# Patient Record
Sex: Female | Born: 1985 | Race: Black or African American | Hispanic: No | Marital: Single | Smoking: Never smoker
Health system: Southern US, Community
[De-identification: ages and names within clinical notes are randomized; demographics above are authoritative.]

## PROBLEM LIST (undated history)

## (undated) DIAGNOSIS — M609 Myositis, unspecified: Secondary | ICD-10-CM

## (undated) DIAGNOSIS — M1990 Unspecified osteoarthritis, unspecified site: Secondary | ICD-10-CM

## (undated) DIAGNOSIS — I011 Acute rheumatic endocarditis: Secondary | ICD-10-CM

## (undated) HISTORY — PX: EYE SURGERY: SHX253

## (undated) HISTORY — DX: Myositis, unspecified: M60.9

## (undated) HISTORY — DX: Acute rheumatic endocarditis: I01.1

## (undated) HISTORY — DX: Unspecified osteoarthritis, unspecified site: M19.90

---

## 2010-11-27 ENCOUNTER — Inpatient Hospital Stay (INDEPENDENT_AMBULATORY_CARE_PROVIDER_SITE_OTHER)
Admission: RE | Admit: 2010-11-27 | Discharge: 2010-11-27 | Disposition: A | Discharge status: Home / Self Care | Payer: Self-pay | Source: Ambulatory Visit | Attending: Emergency Medicine | Admitting: Emergency Medicine

## 2010-11-27 DIAGNOSIS — K006 Disturbances in tooth eruption: Secondary | ICD-10-CM

## 2010-11-29 ENCOUNTER — Inpatient Hospital Stay (INDEPENDENT_AMBULATORY_CARE_PROVIDER_SITE_OTHER)
Admission: RE | Admit: 2010-11-29 | Discharge: 2010-11-29 | Disposition: A | Discharge status: Home / Self Care | Payer: Self-pay | Source: Ambulatory Visit | Attending: Emergency Medicine | Admitting: Emergency Medicine

## 2010-11-29 DIAGNOSIS — T50995A Adverse effect of other drugs, medicaments and biological substances, initial encounter: Secondary | ICD-10-CM

## 2010-11-29 DIAGNOSIS — I10 Essential (primary) hypertension: Secondary | ICD-10-CM

## 2011-08-03 ENCOUNTER — Encounter (HOSPITAL_COMMUNITY): Payer: Self-pay | Admitting: *Deleted

## 2011-08-03 ENCOUNTER — Emergency Department (HOSPITAL_COMMUNITY)
Admission: EM | Admit: 2011-08-03 | Discharge: 2011-08-03 | Disposition: A | Discharge status: Home / Self Care | Payer: BC Managed Care – PPO | Attending: Emergency Medicine | Admitting: Emergency Medicine

## 2011-08-03 DIAGNOSIS — M546 Pain in thoracic spine: Secondary | ICD-10-CM | POA: Insufficient documentation

## 2011-08-03 DIAGNOSIS — H9209 Otalgia, unspecified ear: Secondary | ICD-10-CM | POA: Insufficient documentation

## 2011-08-03 DIAGNOSIS — J45909 Unspecified asthma, uncomplicated: Secondary | ICD-10-CM | POA: Insufficient documentation

## 2011-08-03 DIAGNOSIS — R599 Enlarged lymph nodes, unspecified: Secondary | ICD-10-CM | POA: Insufficient documentation

## 2011-08-03 DIAGNOSIS — J3489 Other specified disorders of nose and nasal sinuses: Secondary | ICD-10-CM | POA: Insufficient documentation

## 2011-08-03 DIAGNOSIS — B9789 Other viral agents as the cause of diseases classified elsewhere: Secondary | ICD-10-CM | POA: Insufficient documentation

## 2011-08-03 DIAGNOSIS — IMO0001 Reserved for inherently not codable concepts without codable children: Secondary | ICD-10-CM | POA: Insufficient documentation

## 2011-08-03 DIAGNOSIS — B349 Viral infection, unspecified: Secondary | ICD-10-CM

## 2011-08-03 MED ORDER — IBUPROFEN 600 MG PO TABS: 600.0000 mg | ORAL_TABLET | Freq: Four times a day (QID) | ORAL | Status: AC | PRN

## 2011-08-03 NOTE — ED Notes (Signed)
C/o body aches, primarily legs and neck, hurts to swallow, onset ~ 2 weeks ago, also reports "feel hot & cold", had chills, (denies: nvd, fever, sob or dizziness). Works in an assisted living facility. Mild redness swelling and irritation in throat noted.

## 2011-08-03 NOTE — ED Notes (Signed)
Complaining of sore throat, left neck pain, and bilateral leg pain. States it began with her ears. Felt like fluid was in her ears. States the pain has moved down from ears to her throat. Pain with swallowing

## 2011-08-03 NOTE — Discharge Instructions (Signed)
Viral Infections A viral infection can be caused by different types of viruses.Most viral infections are not serious and resolve on their own. However, some infections may cause severe symptoms and may lead to further complications. SYMPTOMS Viruses can frequently cause:  Minor sore throat.   Aches and pains.   Headaches.   Runny nose.   Different types of rashes.   Watery eyes.   Tiredness.   Cough.   Loss of appetite.   Gastrointestinal infections, resulting in nausea, vomiting, and diarrhea.  These symptoms do not respond to antibiotics because the infection is not caused by bacteria. However, you might catch a bacterial infection following the viral infection. This is sometimes called a "superinfection." Symptoms of such a bacterial infection may include:  Worsening sore throat with pus and difficulty swallowing.   Swollen neck glands.   Chills and a high or persistent fever.   Severe headache.   Tenderness over the sinuses.   Persistent overall ill feeling (malaise), muscle aches, and tiredness (fatigue).   Persistent cough.   Yellow, green, or brown mucus production with coughing.  HOME CARE INSTRUCTIONS   Only take over-the-counter or prescription medicines for pain, discomfort, diarrhea, or fever as directed by your caregiver.   Drink enough water and fluids to keep your urine clear or pale yellow. Sports drinks can provide valuable electrolytes, sugars, and hydration.   Get plenty of rest and maintain proper nutrition. Soups and broths with crackers or rice are fine.  SEEK IMMEDIATE MEDICAL CARE IF:   You have severe headaches, shortness of breath, chest pain, neck pain, or an unusual rash.   You have uncontrolled vomiting, diarrhea, or you are unable to keep down fluids.   You or your child has an oral temperature above 102 F (38.9 C), not controlled by medicine.   Your baby is older than 3 months with a rectal temperature of 102 F (38.9 C) or  higher.   Your baby is 50 months old or younger with a rectal temperature of 100.4 F (38 C) or higher.  MAKE SURE YOU:   Understand these instructions.   Will watch your condition.   Will get help right away if you are not doing well or get worse.  Document Released: 03/03/2005 Document Revised: 02/03/2011 Document Reviewed: 09/28/2010 [facility] Patient Information 2012 [place], [place]. Stay home rest, take alternating doses of Tylenol or ibuprofen for body aches, or fevers.  Try to drink plenty of fluids

## 2011-08-03 NOTE — ED Provider Notes (Signed)
History     CSN: [id number]  Arrival date & time 08/03/11  1955   First MD Initiated Contact with Patient 08/03/11 2207      Chief Complaint  Patient presents with  . Generalized Body Aches  . 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  She has had several days of myalgias, sore throat, ear pain, nasal congestion.  Denies dysuria, nausea, vomiting, diarrhea.  She has not taken any over-the-counter medications  Patient is a 26 y.o. female presenting with pharyngitis. The history is provided by the patient.  Sore Throat This is a new problem. The current episode started in the past 7 days. The problem occurs constantly. The problem has been unchanged. Associated symptoms include congestion, myalgias, a sore throat and swollen glands. Pertinent negatives include no abdominal pain, anorexia, chills, coughing, fever, nausea or vomiting. The symptoms are aggravated by nothing. She has tried nothing for the symptoms. The treatment provided no relief.    Past Medical History  Diagnosis Date  . Asthma     Past Surgical History  Procedure Date  . Eye surgery     Family History  Problem Relation Age of Onset  . Diabetes Other     History  Substance Use Topics  . Smoking status: Never Smoker   . Smokeless tobacco: Not on file  . Alcohol Use: No    OB History    Grav Para Term Preterm Abortions TAB SAB Ect Mult Living                  Review of Systems  Constitutional: Negative for fever and chills.  HENT: Positive for ear pain, congestion and sore throat. Negative for rhinorrhea.   Respiratory: Negative for cough.   Gastrointestinal: Negative for nausea, vomiting, abdominal pain and anorexia.  Musculoskeletal: Positive for myalgias.  Neurological: Negative for dizziness.    Allergies   Penicillins  Home Medications  No current outpatient prescriptions on file.  BP 100/58  Pulse 99  Temp 99.3 F (37.4 C)  Resp 18  SpO2 99%  LMP 07/03/2011  Physical Exam  Constitutional: She is oriented to person, place, and time. She appears well-developed and well-nourished.  HENT:  Head: Normocephalic.  Right Ear: External ear normal.  Left Ear: External ear normal.  Mouth/Throat: No oropharyngeal exudate.  Cardiovascular: Normal rate.   Pulmonary/Chest: Effort normal.  Abdominal: Soft.  Musculoskeletal: Normal range of motion.  Neurological: She is alert and oriented to person, place, and time.  Skin: Skin is warm and dry.    ED Course  Procedures (including critical care time)  Labs Reviewed - No data to display No results found.   No diagnosis found.    MDM  Viral illness        Arman Filter, NP 08/03/11 2244

## 2011-08-05 NOTE — ED Provider Notes (Signed)
Medical screening examination/treatment/procedure(s) were performed by non-physician practitioner and as supervising physician I was immediately available for consultation/collaboration.   Celene Kras, MD 08/05/11 [phone]

## 2011-08-30 ENCOUNTER — Encounter (INDEPENDENT_AMBULATORY_CARE_PROVIDER_SITE_OTHER): Payer: Self-pay | Admitting: Surgery

## 2011-08-31 ENCOUNTER — Encounter (INDEPENDENT_AMBULATORY_CARE_PROVIDER_SITE_OTHER): Payer: Self-pay | Admitting: Surgery

## 2011-08-31 ENCOUNTER — Ambulatory Visit (INDEPENDENT_AMBULATORY_CARE_PROVIDER_SITE_OTHER): Payer: BC Managed Care – PPO | Admitting: Surgery

## 2011-08-31 VITALS — BP 112/60 | HR 76 | Temp 98.4°F | Resp 12 | Ht 63.0 in | Wt 96.2 lb

## 2011-08-31 DIAGNOSIS — M3390 Dermatopolymyositis, unspecified, organ involvement unspecified: Secondary | ICD-10-CM | POA: Insufficient documentation

## 2011-08-31 DIAGNOSIS — M3320 Polymyositis, organ involvement unspecified: Secondary | ICD-10-CM

## 2011-08-31 NOTE — Patient Instructions (Signed)
Muscle Biopsy Your caregiver has recommended that you have a muscle biopsy (tissue sample) to confirm or prove a suspected muscular problem. During the biopsy, a small piece of muscle tissue is removed. It is then examined under a microscope by a pathologist (a specialist in tissue examination). Chemical tests can be run if they are indicated. Biopsies are taken when your caregiver cannot be 100% certain of the diagnosis (learning what is wrong) by physical exam, X-rays, or other studies. LET YOUR CAREGIVER KNOW ABOUT:  Allergies.   Medications taken including herbs, eye drops, over the counter medications, and creams.   Use of steroids (by mouth or creams)   Previous problems with anesthetics or novocaine.   Possibility of pregnancy, if this applies.   History of blood clots (thrombophlebitis).   History of bleeding or blood problems.   Previous surgery.   Other health problems.  BEFORE THE PROCEDURE  You should be present 60 minutes prior to your procedure or as directed.  PROCEDURE  A biopsy is often performed as a same day surgery. This can be done in a hospital or clinic. Biopsies are often performed under local anesthesia. This is accomplished by injecting medicine that makes the area of biopsy numb. General anesthesia is usually required for very young children, this means they would be sleeping through the procedure. Muscle biopsies are generally performed by making an incision and removing a small piece of muscle.  AFTER THE PROCEDURE  You will be taken to the recovery area where a nurse will watch and check your progress. Once you are awake, stable, and taking fluids well, barring other problems you will be allowed to go home. If the procedure has been minor you may be allowed to go home immediately. Once home, an ice pack applied to your operative site may help with discomfort and keep swelling down.  You may resume normal diet and activities as directed.   If the muscle  biopsy was performed on an arm or leg, avoid vigorous activity until your surgeon says it is okay.   Change dressings as directed.   Only take over-the-counter or prescription medicines for pain, discomfort, or fever as directed by your caregiver.   Call for your results as instructed by your caregiver. Remember it is your responsibility to obtain the results of your biopsy and any other tests performed. Do not assume everything is fine if you do not hear from your caregiver.  SEEK MEDICAL CARE IF:   You have increased bleeding (more than a small spot) from biopsy sites.   You develop redness, swelling, or increasing pain in the biopsy sites.   There is pus coming from the wound.   You have an unexplained oral temperature over 102 F (38.9 C).   A foul smell is coming from the wound or dressing.  SEEK IMMEDIATE MEDICAL CARE IF:   You develop a rash.   You have difficulty breathing.   You have any allergic problems.  Document Released: 08/30/2000 Document Revised: 05/13/2011 Document Reviewed: 09/13/2008 [employer] Patient Information 2012 [employer]. 

## 2011-08-31 NOTE — Progress Notes (Signed)
Chief Complaint  Patient presents with  . New Evaluation    rule out polymyositis - referral from Dr. Pollyann Savoy, [facility]    HISTORY: The patient is a 26 year old black female referred by her rheumatologist for quadriceps muscle biopsy to rule out polymyositis. Patient states that symptoms began acutely in February 2013. She has developed diffuse muscle aches and pains. She has developed significant muscle weakness. She has had significant weight loss. Patient has been evaluated by her rheumatologist and started on steroids. She is referred at this time for quadriceps muscle biopsy to rule out polymyositis.  Past Medical History  Diagnosis Date  . Asthma   . Myositis      Current Outpatient Prescriptions  Medication Sig Dispense Refill  . predniSONE (DELTASONE) 10 MG tablet as directed.         Allergies  Allergen Reactions  . Penicillins Nausea Only     Family History  Problem Relation Age of Onset  . Diabetes Other      History   Social History  . Marital Status: Single    Spouse Name: N/A    Number of Children: N/A  . Years of Education: N/A   Social History Main Topics  . Smoking status: Never Smoker   . Smokeless tobacco: None  . Alcohol Use: No  . Drug Use: No  . Sexually Active:    Other Topics Concern  . None   Social History Narrative  . None     REVIEW OF SYSTEMS - PERTINENT POSITIVES ONLY: As noted above, diffuse muscle aches and pains, weakness, weight loss  EXAM: Filed Vitals:   08/31/11 1327  BP: 112/60  Pulse: 76  Temp: 98.4 F (36.9 C)  Resp: 12    HEENT: normocephalic; pupils equal and reactive; sclerae clear; dentition good; mucous membranes moist NECK:  symmetric on extension; no palpable anterior or posterior cervical lymphadenopathy; no supraclavicular masses; no tenderness CHEST: clear to auscultation bilaterally without rales, rhonchi, or wheezes CARDIAC: regular rate and rhythm without significant murmur; peripheral  pulses are full EXT:  non-tender without edema; no deformity NEURO: no gross focal deficits; no sign of tremor; global weakness   LABORATORY RESULTS: See [facility] ([facility]) for most recent results   RADIOLOGY RESULTS: See [facility] ([facility]-Epic) for most recent results   IMPRESSION: Rule out polymyositis rheumatica  PLAN: I discussed quadriceps muscle biopsy with the patient. Likely we will take this from the vastus medialis. I explained the location of the surgical wound and the type of suture material to be employed. I explained the possibility of poor wound healing due to the patient currently taking steroids. She understands and wishes to proceed. We will make arrangements for muscle biopsy to be performed as an outpatient procedure in the near future.  The risks and benefits of the procedure have been discussed at length with the patient.  The patient understands the proposed procedure, potential alternative treatments, and the course of recovery to be expected.  All of the patient's questions have been answered at this time.  The patient wishes to proceed with surgery.   Velora Heckler, MD, FACS General & Endocrine Surgery [facility]   Visit Diagnoses: 1. Polymyositis     Primary Care Physician: No primary provider on file.  Rheumatology: Dr. Pollyann Savoy

## 2011-09-01 ENCOUNTER — Encounter (HOSPITAL_BASED_OUTPATIENT_CLINIC_OR_DEPARTMENT_OTHER): Payer: Self-pay | Admitting: *Deleted

## 2011-09-02 ENCOUNTER — Encounter (HOSPITAL_BASED_OUTPATIENT_CLINIC_OR_DEPARTMENT_OTHER)
Admission: RE | Disposition: A | Discharge status: Home / Self Care | Payer: Self-pay | Source: Ambulatory Visit | Attending: Surgery

## 2011-09-02 ENCOUNTER — Encounter (HOSPITAL_BASED_OUTPATIENT_CLINIC_OR_DEPARTMENT_OTHER): Payer: Self-pay | Admitting: *Deleted

## 2011-09-02 ENCOUNTER — Encounter (HOSPITAL_BASED_OUTPATIENT_CLINIC_OR_DEPARTMENT_OTHER): Payer: Self-pay | Admitting: Anesthesiology

## 2011-09-02 ENCOUNTER — Ambulatory Visit (HOSPITAL_BASED_OUTPATIENT_CLINIC_OR_DEPARTMENT_OTHER)
Admission: RE | Admit: 2011-09-02 | Discharge: 2011-09-02 | Disposition: A | Discharge status: Home / Self Care | Payer: BC Managed Care – PPO | Source: Ambulatory Visit | Attending: Surgery | Admitting: Surgery

## 2011-09-02 ENCOUNTER — Ambulatory Visit (HOSPITAL_BASED_OUTPATIENT_CLINIC_OR_DEPARTMENT_OTHER): Payer: BC Managed Care – PPO | Admitting: Anesthesiology

## 2011-09-02 DIAGNOSIS — J45909 Unspecified asthma, uncomplicated: Secondary | ICD-10-CM | POA: Insufficient documentation

## 2011-09-02 DIAGNOSIS — G729 Myopathy, unspecified: Secondary | ICD-10-CM

## 2011-09-02 DIAGNOSIS — M3320 Polymyositis, organ involvement unspecified: Secondary | ICD-10-CM

## 2011-09-02 DIAGNOSIS — M6250 Muscle wasting and atrophy, not elsewhere classified, unspecified site: Secondary | ICD-10-CM | POA: Insufficient documentation

## 2011-09-02 HISTORY — PX: MUSCLE BIOPSY: SHX716

## 2011-09-02 SURGERY — MUSCLE BIOPSY
Anesthesia: General | Site: Thigh | Laterality: Left | Wound class: Clean

## 2011-09-02 MED ORDER — FENTANYL CITRATE 0.05 MG/ML IJ SOLN: 25.0000 ug | INTRAMUSCULAR | Status: DC | PRN

## 2011-09-02 MED ORDER — HYDROCODONE-ACETAMINOPHEN 10-325 MG PO TABS: 1.0000 | ORAL_TABLET | ORAL | Status: AC | PRN

## 2011-09-02 MED ORDER — BUPIVACAINE HCL (PF) 0.5 % IJ SOLN
INTRAMUSCULAR | Status: DC | PRN
  Administered 2011-09-02: 10 mL

## 2011-09-02 MED ORDER — ONDANSETRON HCL 4 MG/2ML IJ SOLN
INTRAMUSCULAR | Status: DC | PRN
  Administered 2011-09-02: 4 mg via INTRAVENOUS

## 2011-09-02 MED ORDER — FENTANYL CITRATE 0.05 MG/ML IJ SOLN
INTRAMUSCULAR | Status: DC | PRN
  Administered 2011-09-02: 50 ug via INTRAVENOUS

## 2011-09-02 MED ORDER — VANCOMYCIN HCL IN DEXTROSE 1-5 GM/200ML-% IV SOLN
1000.0000 mg | INTRAVENOUS | Status: AC
  Administered 2011-09-02 (×2): 1000 mg via INTRAVENOUS

## 2011-09-02 MED ORDER — HYDROCODONE-ACETAMINOPHEN 10-325 MG PO TABS: 1.0000 | ORAL_TABLET | Freq: Once | ORAL | Status: DC

## 2011-09-02 MED ORDER — DEXAMETHASONE SODIUM PHOSPHATE 4 MG/ML IJ SOLN
INTRAMUSCULAR | Status: DC | PRN
  Administered 2011-09-02: 10 mg via INTRAVENOUS

## 2011-09-02 MED ORDER — ONDANSETRON HCL 4 MG/2ML IJ SOLN: 4.0000 mg | Freq: Four times a day (QID) | INTRAMUSCULAR | Status: DC | PRN

## 2011-09-02 MED ORDER — MIDAZOLAM HCL 5 MG/5ML IJ SOLN
INTRAMUSCULAR | Status: DC | PRN
  Administered 2011-09-02: 1 mg via INTRAVENOUS

## 2011-09-02 MED ORDER — LIDOCAINE HCL (CARDIAC) 20 MG/ML IV SOLN
INTRAVENOUS | Status: DC | PRN
  Administered 2011-09-02: 55 mg via INTRAVENOUS

## 2011-09-02 MED ORDER — LACTATED RINGERS IV SOLN
INTRAVENOUS | Status: DC
  Administered 2011-09-02: 10:00:00 via INTRAVENOUS

## 2011-09-02 MED ORDER — PROPOFOL 10 MG/ML IV EMUL
INTRAVENOUS | Status: DC | PRN
  Administered 2011-09-02: 200 mg via INTRAVENOUS

## 2011-09-02 SURGICAL SUPPLY — 42 items
BANDAGE ELASTIC 4 VELCRO ST LF (GAUZE/BANDAGES/DRESSINGS) ×2 IMPLANT
BANDAGE GAUZE ELAST BULKY 4 IN (GAUZE/BANDAGES/DRESSINGS) IMPLANT
BENZOIN TINCTURE PRP APPL 2/3 (GAUZE/BANDAGES/DRESSINGS) ×2 IMPLANT
BLADE SURG 15 STRL LF DISP TIS (BLADE) ×1 IMPLANT
BLADE SURG 15 STRL SS (BLADE) ×1
BNDG COHESIVE 4X5 TAN STRL (GAUZE/BANDAGES/DRESSINGS) IMPLANT
CHLORAPREP W/TINT 26ML (MISCELLANEOUS) ×2 IMPLANT
CLEANER CAUTERY TIP 5X5 PAD (MISCELLANEOUS) IMPLANT
CLOTH BEACON ORANGE TIMEOUT ST (SAFETY) ×2 IMPLANT
COVER MAYO STAND STRL (DRAPES) ×2 IMPLANT
COVER TABLE BACK 60X90 (DRAPES) ×2 IMPLANT
DECANTER SPIKE VIAL GLASS SM (MISCELLANEOUS) IMPLANT
DRAPE EXTREMITY T 121X128X90 (DRAPE) IMPLANT
DRAPE PED LAPAROTOMY (DRAPES) ×2 IMPLANT
DRAPE U-SHAPE 76X120 STRL (DRAPES) IMPLANT
DRAPE UTILITY XL STRL (DRAPES) ×2 IMPLANT
DRSG TEGADERM 4X4.75 (GAUZE/BANDAGES/DRESSINGS) IMPLANT
ELECT REM PT RETURN 9FT ADLT (ELECTROSURGICAL) ×2
ELECTRODE REM PT RTRN 9FT ADLT (ELECTROSURGICAL) ×1 IMPLANT
GAUZE SPONGE 4X4 12PLY STRL LF (GAUZE/BANDAGES/DRESSINGS) ×4 IMPLANT
GLOVE BIOGEL M 7.0 STRL (GLOVE) ×2 IMPLANT
GLOVE BIOGEL PI IND STRL 7.5 (GLOVE) ×1 IMPLANT
GLOVE BIOGEL PI INDICATOR 7.5 (GLOVE) ×1
GLOVE SURG ORTHO 8.0 STRL STRW (GLOVE) ×2 IMPLANT
GOWN PREVENTION PLUS XLARGE (GOWN DISPOSABLE) ×2 IMPLANT
GOWN PREVENTION PLUS XXLARGE (GOWN DISPOSABLE) ×2 IMPLANT
NEEDLE HYPO 25X1 1.5 SAFETY (NEEDLE) ×2 IMPLANT
PACK BASIN DAY SURGERY FS (CUSTOM PROCEDURE TRAY) ×2 IMPLANT
PAD CLEANER CAUTERY TIP 5X5 (MISCELLANEOUS)
PENCIL BUTTON HOLSTER BLD 10FT (ELECTRODE) ×2 IMPLANT
SHEET MEDIUM DRAPE 40X70 STRL (DRAPES) ×2 IMPLANT
SLEEVE SCD COMPRESS KNEE MED (MISCELLANEOUS) ×2 IMPLANT
STOCKINETTE 4X48 STRL (DRAPES) IMPLANT
STRIP CLOSURE SKIN 1/2X4 (GAUZE/BANDAGES/DRESSINGS) ×2 IMPLANT
SUT ETHILON 3 0 PS 1 (SUTURE) ×2 IMPLANT
SUT MNCRL AB 4-0 PS2 18 (SUTURE) ×2 IMPLANT
SUT VICRYL 3-0 CR8 SH (SUTURE) ×2 IMPLANT
SUT VICRYL 4-0 PS2 18IN ABS (SUTURE) IMPLANT
SYR CONTROL 10ML LL (SYRINGE) ×2 IMPLANT
TOWEL OR 17X24 6PK STRL BLUE (TOWEL DISPOSABLE) ×4 IMPLANT
TOWEL OR NON WOVEN STRL DISP B (DISPOSABLE) ×2 IMPLANT
WATER STERILE IRR 1000ML POUR (IV SOLUTION) IMPLANT

## 2011-09-02 NOTE — Anesthesia Preprocedure Evaluation (Signed)
Anesthesia Evaluation  Patient identified by MRN, date of birth, ID band Patient awake    Reviewed: Allergy & Precautions, H&P , NPO status , Patient's Chart, lab work & pertinent test results  Airway Mallampati: II  Neck ROM: full    Dental   Pulmonary asthma ,          Cardiovascular     Neuro/Psych  Neuromuscular disease    GI/Hepatic   Endo/Other    Renal/GU      Musculoskeletal   Abdominal   Peds  Hematology   Anesthesia Other Findings   Reproductive/Obstetrics                           Anesthesia Physical Anesthesia Plan  ASA: II  Anesthesia Plan: General   Post-op Pain Management:    Induction: Intravenous  Airway Management Planned: LMA  Additional Equipment:   Intra-op Plan:   Post-operative Plan:   Informed Consent: I have reviewed the patients History and Physical, chart, labs and discussed the procedure including the risks, benefits and alternatives for the proposed anesthesia with the patient or authorized representative who has indicated his/her understanding and acceptance.     Plan Discussed with: CRNA and Surgeon  Anesthesia Plan Comments:         Anesthesia Quick Evaluation

## 2011-09-02 NOTE — Interval H&P Note (Signed)
History and Physical Interval Note:  09/02/2011 10:31 AM  Elizabeth Jensen  has presented today for surgery, with the diagnosis of r/o polymyositis.  The various methods of treatment have been discussed with the patient and family. After consideration of risks, benefits and other options for treatment, the patient has consented to    Procedure(s) (LRB): MUSCLE BIOPSY (N/A) as a surgical intervention .    The patients' history has been reviewed, patient examined, no change in status, stable for surgery.  I have reviewed the patients' chart and labs.  Questions were answered to the patient's satisfaction.    Velora Heckler, MD, [facility] Office: [phone]    Latwan Luchsinger Judie Petit

## 2011-09-02 NOTE — Anesthesia Procedure Notes (Signed)
Procedure Name: LMA Insertion Date/Time: 09/02/2011 10:48 AM Performed by: Zenia Resides D Pre-anesthesia Checklist: Patient identified, Emergency Drugs available, Suction available, Patient being monitored and Timeout performed Patient Re-evaluated:Patient Re-evaluated prior to inductionOxygen Delivery Method: Circle System Utilized Preoxygenation: Pre-oxygenation with 100% oxygen Intubation Type: IV induction Ventilation: Mask ventilation without difficulty LMA: LMA inserted LMA Size: 4.0 Number of attempts: 1 Airway Equipment and Method: bite block Placement Confirmation: positive ETCO2 and breath sounds checked- equal and bilateral Tube secured with: Tape Dental Injury: Teeth and Oropharynx as per pre-operative assessment

## 2011-09-02 NOTE — H&P (View-Only) (Signed)
Chief Complaint  Patient presents with  . New Evaluation    rule out polymyositis - referral from Dr. Shaili Deveshwar, [facility]    HISTORY: The patient is a 25-year-old black female referred by her rheumatologist for quadriceps muscle biopsy to rule out polymyositis. Patient states that symptoms began acutely in February 2013. She has developed diffuse muscle aches and pains. She has developed significant muscle weakness. She has had significant weight loss. Patient has been evaluated by her rheumatologist and started on steroids. She is referred at this time for quadriceps muscle biopsy to rule out polymyositis.  Past Medical History  Diagnosis Date  . Asthma   . Myositis      Current Outpatient Prescriptions  Medication Sig Dispense Refill  . predniSONE (DELTASONE) 10 MG tablet as directed.         Allergies  Allergen Reactions  . Penicillins Nausea Only     Family History  Problem Relation Age of Onset  . Diabetes Other      History   Social History  . Marital Status: Single    Spouse Name: N/A    Number of Children: N/A  . Years of Education: N/A   Social History Main Topics  . Smoking status: Never Smoker   . Smokeless tobacco: None  . Alcohol Use: No  . Drug Use: No  . Sexually Active:    Other Topics Concern  . None   Social History Narrative  . None     REVIEW OF SYSTEMS - PERTINENT POSITIVES ONLY: As noted above, diffuse muscle aches and pains, weakness, weight loss  EXAM: Filed Vitals:   08/31/11 1327  BP: 112/60  Pulse: 76  Temp: 98.4 F (36.9 C)  Resp: 12    HEENT: normocephalic; pupils equal and reactive; sclerae clear; dentition good; mucous membranes moist NECK:  symmetric on extension; no palpable anterior or posterior cervical lymphadenopathy; no supraclavicular masses; no tenderness CHEST: clear to auscultation bilaterally without rales, rhonchi, or wheezes CARDIAC: regular rate and rhythm without significant murmur; peripheral  pulses are full EXT:  non-tender without edema; no deformity NEURO: no gross focal deficits; no sign of tremor; global weakness   LABORATORY RESULTS: See [facility] ([facility]L-Epic) for most recent results   RADIOLOGY RESULTS: See [facility] ([facility]-Epic) for most recent results   IMPRESSION: Rule out polymyositis rheumatica  PLAN: I discussed quadriceps muscle biopsy with the patient. Likely we will take this from the vastus medialis. I explained the location of the surgical wound and the type of suture material to be employed. I explained the possibility of poor wound healing due to the patient currently taking steroids. She understands and wishes to proceed. We will make arrangements for muscle biopsy to be performed as an outpatient procedure in the near future.  The risks and benefits of the procedure have been discussed at length with the patient.  The patient understands the proposed procedure, potential alternative treatments, and the course of recovery to be expected.  All of the patient's questions have been answered at this time.  The patient wishes to proceed with surgery.   Malayla Granberry M. Krew Hortman, MD, FACS General & Endocrine Surgery [facility]   Visit Diagnoses: 1. Polymyositis     Primary Care Physician: No primary provider on file.  Rheumatology: Dr. Shaili Deveshwar  

## 2011-09-02 NOTE — Brief Op Note (Signed)
09/02/2011  11:24 AM  PATIENT:  Elizabeth Jensen  26 y.o. female  PRE-OPERATIVE DIAGNOSIS:  r/o polymyositis  POST-OPERATIVE DIAGNOSIS:  r/o polymyositis  PROCEDURE:  Procedure(s) (LRB): QUADRICEPS MUSCLE BIOPSY (Left)  SURGEON:  Surgeon(s) and Role:    * Velora Heckler, MD - Primary  ANESTHESIA:   general  EBL:  Total I/O In: 600 [I.V.:600] Out: -   BLOOD ADMINISTERED:none  DRAINS: none   LOCAL MEDICATIONS USED:  MARCAINE     SPECIMEN:  Excision  DISPOSITION OF SPECIMEN:  PATHOLOGY  COUNTS:  YES  TOURNIQUET:  * No tourniquets in log *  DICTATION: Reubin Milan Dictation and Other Dictation: Dictation Number [id number]  PLAN OF CARE: Discharge to home after PACU  PATIENT DISPOSITION:  PACU - hemodynamically stable.   Delay start of Pharmacological VTE agent (>24hrs) due to surgical blood loss or risk of bleeding: yes  Velora Heckler, MD, [facility] Office: [phone]

## 2011-09-02 NOTE — Anesthesia Postprocedure Evaluation (Signed)
Anesthesia Post Note  Patient: Elizabeth Jensen  Procedure(s) Performed: Procedure(s) (LRB): MUSCLE BIOPSY (Left)  Anesthesia type: General  Patient location: PACU  Post pain: Pain level controlled and Adequate analgesia  Post assessment: Post-op Vital signs reviewed, Patient's Cardiovascular Status Stable, Respiratory Function Stable, Patent Airway and Pain level controlled  Last Vitals:  Filed Vitals:   09/02/11 1130  BP: 132/95  Pulse: 70  Temp: 36.3 C  Resp:     Post vital signs: Reviewed and stable  Level of consciousness: awake, alert  and oriented  Complications: No apparent anesthesia complications

## 2011-09-02 NOTE — Discharge Instructions (Signed)
Surgery Center  [address] [place], [place] [address] [phone]   Post Anesthesia Home Care Instructions  Activity: Get plenty of rest for the remainder of the day. A responsible adult should stay with you for 24 hours following the procedure.  For the next 24 hours, DO NOT: -Drive a car -Operate machinery -Drink alcoholic beverages -Take any medication unless instructed by your physician -Make any legal decisions or sign important papers.  Meals: Start with liquid foods such as gelatin or soup. Progress to regular foods as tolerated. Avoid greasy, spicy, heavy foods. If nausea and/or vomiting occur, drink only clear liquids until the nausea and/or vomiting subsides. Call your physician if vomiting continues.  Special Instructions/Symptoms: Your throat may feel dry or sore from the anesthesia or the breathing tube placed in your throat during surgery. If this causes discomfort, gargle with warm salt water. The discomfort should disappear within 24 hours.   

## 2011-09-02 NOTE — Transfer of Care (Signed)
Immediate Anesthesia Transfer of Care Note  Patient: Elizabeth Jensen  Procedure(s) Performed: Procedure(s) (LRB): MUSCLE BIOPSY (Left)  Patient Location: PACU  Anesthesia Type: General  Level of Consciousness: sedated  Airway & Oxygen Therapy: Patient Spontanous Breathing and Patient connected to face mask oxygen  Post-op Assessment: Report given to PACU RN and Post -op Vital signs reviewed and stable  Post vital signs: Reviewed and stable  Complications: No apparent anesthesia complications

## 2011-09-04 NOTE — Op Note (Signed)
NAME:  Elizabeth Jensen, Elizabeth Jensen                    ACCOUNT NO.:  MEDICAL RECORD NO.:  [id number]  LOCATION:                                 FACILITY:  PHYSICIAN:  Velora Heckler, MD      DATE OF BIRTH:  31-Jan-1986  DATE OF PROCEDURE:  09/02/2011                               OPERATIVE REPORT   PREOPERATIVE DIAGNOSIS:  Rule out polymyositis rheumatica.  POSTOPERATIVE DIAGNOSIS:  Rule out polymyositis rheumatica.  PROCEDURE:  Left medial head quadriceps muscle excisional biopsy.  SURGEON:  Velora Heckler, MD, FACS  ANESTHESIA:  General.  ESTIMATED BLOOD LOSS:  Minimal.  PREPARATION:  ChloraPrep.  COMPLICATIONS:  None.  INDICATIONS:  The patient is a 26 year old, black female undergoing workup for polymyositis by Dr. Pollyann Savoy.  The patient now comes to surgery for quadriceps biopsy.  BODY OF REPORT:  Procedure was done in OR #8 at the [facility].  The patient was brought to the operating room, placed in supine position on the operating room table.  Following administration of general anesthesia, the left lower extremity was positioned and then prepped and draped in the usual strict aseptic fashion.  After ascertaining that an adequate level of anesthesia had been achieved, a 3- cm incision was made over the medial head of the left quadriceps muscle. Dissection was carried down to the fascia.  Fascia was incised and the muscle was exposed.  A segment of the medial head of the quadriceps was excised by dissecting it out longitudinally along the muscle fibers.  It was ligated proximally and distally with a 2-0 silk ligature.  A 2-cm segment was then sharply excised.  It was placed on a tongue depressor and secured with 2-0 silk sutures.  It was wrapped with saline moistened gauze, then submitted fresh to Pathology for review.  Good hemostasis was noted.  Muscle fascia was closed with interrupted 3- 0 Vicryl sutures.  Subcutaneous tissues were closed with  interrupted 3-0 Vicryl sutures.  Tissue was anesthetized with local Marcaine 0.5%.  Skin was closed with a running 4-0 Monocryl subcuticular suture.  Wound was washed and dried and benzoin and Steri-Strips were applied.  Sterile dressings were applied.  The patient was awakened from anesthesia and brought to the recovery room.  The patient tolerated the procedure well.   Velora Heckler, MD, FACS   TMG/MEDQ  D:  09/02/2011  T:  09/02/2011  Job:  [id number]  cc:   Pollyann Savoy, M.D.

## 2011-09-06 ENCOUNTER — Telehealth (INDEPENDENT_AMBULATORY_CARE_PROVIDER_SITE_OTHER): Payer: Self-pay

## 2011-09-06 NOTE — Telephone Encounter (Signed)
Patient called in to make follow up appointment. States Gerkin told her to come back within a week from surgery. Please call her [phone].

## 2011-09-06 NOTE — Telephone Encounter (Signed)
C/o left foot and ankle swelling since surgery 09/02/2011. Left toes and foot cool to the touch.  Per Dr. Magnus Ivan have patient go the ER for evaluation.  Left medial head quadriceps muscle biopsy on 09/02/2011.

## 2011-09-07 ENCOUNTER — Encounter (HOSPITAL_BASED_OUTPATIENT_CLINIC_OR_DEPARTMENT_OTHER): Payer: Self-pay

## 2011-09-07 ENCOUNTER — Encounter (HOSPITAL_BASED_OUTPATIENT_CLINIC_OR_DEPARTMENT_OTHER): Payer: Self-pay | Admitting: Surgery

## 2011-09-13 ENCOUNTER — Ambulatory Visit (INDEPENDENT_AMBULATORY_CARE_PROVIDER_SITE_OTHER): Payer: BC Managed Care – PPO | Admitting: Surgery

## 2011-09-13 ENCOUNTER — Encounter (INDEPENDENT_AMBULATORY_CARE_PROVIDER_SITE_OTHER): Payer: Self-pay | Admitting: Surgery

## 2011-09-13 VITALS — BP 110/68 | HR 66 | Temp 97.6°F | Resp 18 | Ht 63.0 in | Wt 99.2 lb

## 2011-09-13 DIAGNOSIS — M3320 Polymyositis, organ involvement unspecified: Secondary | ICD-10-CM

## 2011-09-13 NOTE — Progress Notes (Signed)
Visit Diagnoses: 1. Polymyositis     HISTORY: Patient returns today for followup having undergone left quadriceps muscle biopsy to evaluate for possible myositis. Final pathology results are pending.  EXAM: Surgical wound is healing uneventfully on the medial aspect of the left quadriceps muscle. Steri-Strips remain in place. Minimal soft tissue swelling. No sign of infection.  IMPRESSION: Rule out myositis, status post left quadriceps muscle biopsy  PLAN: Patient will followup with her rheumatologist next week. Pathology results are currently pending. Wound care instructions are given to the patient and family.  Patient will return to see me as needed.  Velora Heckler, MD, FACS General & Endocrine Surgery [facility]

## 2011-09-13 NOTE — Patient Instructions (Signed)
  COCOA BUTTER & VITAMIN E CREAM  ([employer] or other brand)  Apply cocoa butter/vitamin E cream to your incision 2 - 3 times daily.  Massage cream into incision for one minute with each application.  Use sunscreen (50 SPF or higher) for first 6 months after surgery if area is exposed to sun.  You may substitute Mederma or other scar reducing creams as desired.   

## 2011-09-21 ENCOUNTER — Other Ambulatory Visit (HOSPITAL_COMMUNITY): Payer: Self-pay | Admitting: Rheumatology

## 2011-09-21 DIAGNOSIS — C801 Malignant (primary) neoplasm, unspecified: Secondary | ICD-10-CM

## 2011-09-24 ENCOUNTER — Ambulatory Visit (HOSPITAL_COMMUNITY)
Admission: RE | Admit: 2011-09-24 | Discharge: 2011-09-24 | Disposition: A | Discharge status: Home / Self Care | Payer: BC Managed Care – PPO | Source: Ambulatory Visit | Attending: Rheumatology | Admitting: Rheumatology

## 2011-09-24 ENCOUNTER — Ambulatory Visit (HOSPITAL_COMMUNITY): Admission: RE | Admit: 2011-09-24 | Payer: BC Managed Care – PPO | Source: Ambulatory Visit

## 2011-09-24 DIAGNOSIS — Z129 Encounter for screening for malignant neoplasm, site unspecified: Secondary | ICD-10-CM | POA: Insufficient documentation

## 2011-09-24 DIAGNOSIS — C801 Malignant (primary) neoplasm, unspecified: Secondary | ICD-10-CM

## 2011-09-24 MED ORDER — IOHEXOL 300 MG/ML  SOLN
80.0000 mL | Freq: Once | INTRAMUSCULAR | Status: AC | PRN
  Administered 2011-09-24: 80 mL via INTRAVENOUS

## 2011-09-28 ENCOUNTER — Ambulatory Visit (HOSPITAL_COMMUNITY)
Admission: RE | Admit: 2011-09-28 | Discharge: 2011-09-28 | Disposition: A | Discharge status: Home / Self Care | Payer: BC Managed Care – PPO | Source: Ambulatory Visit | Attending: Rheumatology | Admitting: Rheumatology

## 2011-09-28 DIAGNOSIS — M7989 Other specified soft tissue disorders: Secondary | ICD-10-CM | POA: Insufficient documentation

## 2011-09-28 DIAGNOSIS — M79609 Pain in unspecified limb: Secondary | ICD-10-CM | POA: Insufficient documentation

## 2011-09-28 DIAGNOSIS — M79606 Pain in leg, unspecified: Secondary | ICD-10-CM

## 2011-10-14 ENCOUNTER — Institutional Professional Consult (permissible substitution): Payer: BC Managed Care – PPO | Admitting: Emergency Medicine

## 2011-11-03 ENCOUNTER — Encounter (INDEPENDENT_AMBULATORY_CARE_PROVIDER_SITE_OTHER): Payer: Self-pay

## 2011-11-25 ENCOUNTER — Institutional Professional Consult (permissible substitution): Payer: BC Managed Care – PPO | Admitting: Emergency Medicine

## 2011-12-28 ENCOUNTER — Encounter (HOSPITAL_COMMUNITY): Payer: Self-pay | Admitting: *Deleted

## 2011-12-28 ENCOUNTER — Emergency Department (HOSPITAL_COMMUNITY): Payer: BC Managed Care – PPO

## 2011-12-28 ENCOUNTER — Emergency Department (HOSPITAL_COMMUNITY)
Admission: EM | Admit: 2011-12-28 | Discharge: 2011-12-29 | Disposition: A | Discharge status: Home / Self Care | Payer: BC Managed Care – PPO | Attending: Emergency Medicine | Admitting: Emergency Medicine

## 2011-12-28 DIAGNOSIS — Z79899 Other long term (current) drug therapy: Secondary | ICD-10-CM | POA: Insufficient documentation

## 2011-12-28 DIAGNOSIS — IMO0002 Reserved for concepts with insufficient information to code with codable children: Secondary | ICD-10-CM | POA: Insufficient documentation

## 2011-12-28 DIAGNOSIS — N390 Urinary tract infection, site not specified: Secondary | ICD-10-CM | POA: Insufficient documentation

## 2011-12-28 DIAGNOSIS — R1011 Right upper quadrant pain: Secondary | ICD-10-CM | POA: Insufficient documentation

## 2011-12-28 DIAGNOSIS — D72829 Elevated white blood cell count, unspecified: Secondary | ICD-10-CM | POA: Insufficient documentation

## 2011-12-28 DIAGNOSIS — R112 Nausea with vomiting, unspecified: Secondary | ICD-10-CM | POA: Insufficient documentation

## 2011-12-28 DIAGNOSIS — IMO0001 Reserved for inherently not codable concepts without codable children: Secondary | ICD-10-CM | POA: Insufficient documentation

## 2011-12-28 LAB — CBC WITH DIFFERENTIAL/PLATELET
Basophils Absolute: 0 10*3/uL (ref 0.0–0.1)
Basophils Relative: 0 % (ref 0–1)
Eosinophils Relative: 0 % (ref 0–5)
HCT: 36.4 % (ref 36.0–46.0)
MCHC: 34.1 g/dL (ref 30.0–36.0)
Monocytes Absolute: 1.1 10*3/uL — ABNORMAL HIGH (ref 0.1–1.0)
Neutro Abs: 17.4 10*3/uL — ABNORMAL HIGH (ref 1.7–7.7)
Platelets: 312 10*3/uL (ref 150–400)
RDW: 17.4 % — ABNORMAL HIGH (ref 11.5–15.5)

## 2011-12-28 LAB — HEPATIC FUNCTION PANEL
AST: 22 U/L (ref 0–37)
Albumin: 3.8 g/dL (ref 3.5–5.2)
Alkaline Phosphatase: 81 U/L (ref 39–117)
Total Bilirubin: 0.4 mg/dL (ref 0.3–1.2)

## 2011-12-28 LAB — BASIC METABOLIC PANEL
BUN: 11 mg/dL (ref 6–23)
Calcium: 10 mg/dL (ref 8.4–10.5)
Chloride: 96 mEq/L (ref 96–112)
Creatinine, Ser: 0.82 mg/dL (ref 0.50–1.10)
GFR calc Af Amer: >90 mL/min (ref >90)

## 2011-12-28 LAB — URINALYSIS, ROUTINE W REFLEX MICROSCOPIC
Ketones, ur: NEGATIVE mg/dL
Nitrite: NEGATIVE
Specific Gravity, Urine: 1.028 (ref 1.005–1.030)
Urobilinogen, UA: 1 mg/dL (ref 0.0–1.0)

## 2011-12-28 MED ORDER — IOHEXOL 300 MG/ML  SOLN: 20.0000 mL | INTRAMUSCULAR | Status: AC

## 2011-12-28 MED ORDER — SODIUM CHLORIDE 0.9 % IV SOLN
Freq: Once | INTRAVENOUS | Status: AC
  Administered 2011-12-28: 20:00:00 via INTRAVENOUS

## 2011-12-28 MED ORDER — ONDANSETRON HCL 4 MG/2ML IJ SOLN
4.0000 mg | Freq: Once | INTRAMUSCULAR | Status: AC
  Administered 2011-12-28: 4 mg via INTRAVENOUS
  Filled 2011-12-28: qty 2

## 2011-12-28 MED ORDER — MORPHINE SULFATE 4 MG/ML IJ SOLN
4.0000 mg | Freq: Once | INTRAMUSCULAR | Status: AC
  Administered 2011-12-28: 4 mg via INTRAVENOUS
  Filled 2011-12-28: qty 1

## 2011-12-28 NOTE — ED Notes (Signed)
RUQ pain that increased since yesterday.  Vomited times one.  No diarrhea or constipation.  No urinary syptoms.  LMP 12/21/11.  No vag discharge or bleeding

## 2011-12-28 NOTE — ED Provider Notes (Signed)
History     CSN: [id number]  Arrival date & time 12/28/11  1610   First MD Initiated Contact with Patient 12/28/11 1939      Chief Complaint  Patient presents with  . Abdominal Pain    (Consider location/radiation/quality/duration/timing/severity/associated sxs/prior treatment) HPI History from patient. 26 year old female presents with abdominal pain. She states that she did notice this intermittently yesterday but it became constant and severe today. Pain is described as sharp and is located to primarily the right upper quadrant with radiation to the right lower quadrant. Pain worsens with palpation. Improves with rest. She has not had anything like this previously. She has had nausea with one episode of emesis associated with this. Denies changes in bowel movements, urinary symptoms, vaginal bleeding or discharge. No fever or chills or changes in appetite. Last menstrual period was July 16 and was normal for her. No history of abdominal surgery.  Past Medical History  Diagnosis Date  . Myositis   . Asthma     NO PROBLEMS NOW    Past Surgical History  Procedure Date  . Eye surgery   . Muscle biopsy 09/02/2011    Procedure: MUSCLE BIOPSY;  Surgeon: Velora Heckler, MD;  Location: [facility];  Service: General;  Laterality: Left;  Quadriceps muscle biospy    Family History  Problem Relation Age of Onset  . Diabetes Other     History  Substance Use Topics  . Smoking status: Never Smoker   . Smokeless tobacco: Never Used  . Alcohol Use: No    OB History    Grav Para Term Preterm Abortions TAB SAB Ect Mult Living                  Review of Systems  All other systems reviewed and are negative.    Allergies  Penicillins  Home Medications   Current Outpatient Rx  Name Route Sig Dispense Refill  . FOLIC ACID 1 MG PO TABS Oral Take 2 mg by mouth daily.    Marland Kitchen METHOTREXATE 2.5 MG PO TABS Oral Take 20 mg by mouth once a week. Takes on Friday!    Marland Kitchen  PREDNISONE 5 MG PO TABS Oral Take 22.5 mg by mouth daily.      BP 106/54  Pulse 87  Temp 99 F (37.2 C) (Oral)  Resp 18  SpO2 100%  LMP 12/21/2011  Physical Exam  Nursing note and vitals reviewed. Constitutional: She appears well-developed and well-nourished. No distress.  HENT:  Head: Normocephalic and atraumatic.  Eyes:       Normal appearance  Neck: Normal range of motion.  Cardiovascular: Normal rate, regular rhythm and normal heart sounds.   Pulmonary/Chest: Effort normal and breath sounds normal. She exhibits no tenderness.  Abdominal: Soft. Bowel sounds are normal. She exhibits no distension. There is tenderness.       Tenderness and guarding in the right upper and lower quadrants, more pronounced in the upper quadrant. There is no suprapubic tenderness. No rebound. Negative jar, obturator.  Musculoskeletal: Normal range of motion.  Neurological: She is alert.  Skin: Skin is warm and dry. She is not diaphoretic.  Psychiatric: She has a normal mood and affect.    ED Course  Procedures (including critical care time)  Labs Reviewed  BASIC METABOLIC PANEL - Abnormal; Notable for the following:    Glucose, Bld 107 (*)     All other components within normal limits  CBC WITH DIFFERENTIAL - Abnormal; Notable for  the following:    WBC 21.0 (*)     RDW 17.4 (*)     Neutrophils Relative 83 (*)     Neutro Abs 17.4 (*)     Lymphocytes Relative 11 (*)     Monocytes Absolute 1.1 (*)     All other components within normal limits  URINALYSIS, ROUTINE W REFLEX MICROSCOPIC - Abnormal; Notable for the following:    Color, Urine AMBER (*)  BIOCHEMICALS MAY BE AFFECTED BY COLOR   APPearance CLOUDY (*)     Bilirubin Urine SMALL (*)     Leukocytes, UA LARGE (*)     All other components within normal limits  URINE MICROSCOPIC-ADD ON - Abnormal; Notable for the following:    Squamous Epithelial / LPF MANY (*)     Bacteria, UA MANY (*)     Crystals CA OXALATE CRYSTALS (*)     All  other components within normal limits  HEPATIC FUNCTION PANEL   US Abdomen Complete  12/28/2011  *RADIOLOGY REPORT*  Clinical Data:  Right upper quadrant abdominal pain and leukocytosis.  ABDOMEN ULTRASOUND  Technique:  Complete abdominal ultrasound examination was performed including evaluation of the liver, gallbladder, bile ducts, pancreas, kidneys, spleen, IVC, and abdominal aorta.  Comparison:  CT of the abdomen dated 09/24/2011  Findings:  Gallbladder:  No shadowing gallstones or echogenic sludge.  No gallbladder wall thickening or pericholecystic fluid.  Negative sonographic Murphy's sign according to the ultrasound technologist.  Common Bile Duct:  Normal caliber of 4 mm.  Liver:  Normal size and echotexture without focal parenchymal abnormality.  Patent portal vein with hepatopetal flow.  IVC:  Patent throughout its visualized course in the abdomen.  Pancreas:  Although the pancreas is difficult to visualize in its entirety, no focal pancreatic abnormality is identified.  Spleen:  The spleen shows normal echotexture and size.  Kidneys:  Both kidneys are normal sonographic appearance without evidence of hydronephrosis or focal lesion.  The right kidney measures 9.5 cm and the left kidney 9.7 cm.  IMPRESSION: Normal abdominal ultrasound.  Original Report Authenticated By: Reola Calkins, M.D.   Ct Abdomen Pelvis W Contrast  12/29/2011  *RADIOLOGY REPORT*  Clinical Data: Right upper quadrant abdominal pain extending to the flank.  CT ABDOMEN AND PELVIS WITH CONTRAST  Technique:  Multidetector CT imaging of the abdomen and pelvis was performed following the standard protocol during bolus administration of intravenous contrast.  Contrast: 1 OMNIPAQUE IOHEXOL 300 MG/ML  SOLN, OMNIPAQUE IOHEXOL 300 MG/ML  SOLN  Comparison: 12/28/2011; 09/24/2011  Findings: Lower lobe linear subsegmental atelectasis noted dependently.  The liver appears unremarkable.  Stable hypodense lesion along the anterior  spleen measures 0.6 cm in diameter, possibly a small splenic hemangioma or cystic lesion.  Pancreas divisum is suspected, without dilatation of the dorsal pancreatic duct.  The pancreas appears otherwise unremarkable.  The adrenal glands appear normal.  The kidneys appear unremarkable, as do the proximal ureters.  The gallbladder appears unremarkable.  No pathologic retroperitoneal or porta hepatis adenopathy is identified.  No pathologic pelvic adenopathy is identified.  Small mesenteric lymph nodes are not pathologically enlarged by size criteria.  A cystic lesion of the right ovary measures 2.9 x 2.3 cm on image 65 of series 2.  There may be another smaller cyst or follicle in the right ovary.  Uterus appears unremarkable.  Trace free pelvic fluid is probably physiologic.  The appendix is difficult to visualize.  A thin candidate structure extending from the tip  of the cecum medially on images 43-46 of series 5 does not appear inflamed.  IMPRESSION:  1.  Right ovarian cyst measures up to 2.9 cm. 2.  Suspected pancreas divisum, without dilatation of the dorsal pancreatic duct. 3.  Poor visualization of the appendix, without localized inflammation observed.  4.  Dependent subsegmental atelectasis in the lower lobes. 5.  Stable 0.6 cm hypodense lesion anteriorly in the spleen, likely a small benign incidental lesion.  Original Report Authenticated By: Dellia Cloud, M.D.     1. Urinary tract infection       MDM  Pt with tenderness to RUQ since yesterday; on exam appears to have tenderness to RUQ and RLQ, worst in upper. She has a white count of 21k. She is nontoxic in appearance. Pt's urine appears to have poss UTI although contaminated with epithelials. Imaging studies negative except for small ovarian cyst on the R. Pt is on methotrexate and prednisone for myositis so it's possible that this is the etiology of her leukocytosis (in combination with poss UTI). Pt will be treated with abx. Reasons to  return for worsening, changing, or new pain discussed. Pt verbalized understanding and agreed to plan.        Grant Fontana, PA-C 12/29/11 [phone]

## 2011-12-28 NOTE — ED Notes (Signed)
Pt given PO contrast by CT tech. Nurse to check in on pt to make sure she is drinking the contrast

## 2011-12-28 NOTE — ED Notes (Signed)
Came in by ems with right upper quad pain that radiates down into flank.  Pt had some vomiting yesterday.  HR- 110-120.

## 2011-12-28 NOTE — ED Notes (Signed)
Pt in US

## 2011-12-29 ENCOUNTER — Emergency Department (HOSPITAL_COMMUNITY): Payer: BC Managed Care – PPO

## 2011-12-29 ENCOUNTER — Encounter (HOSPITAL_COMMUNITY): Payer: Self-pay | Admitting: Radiology

## 2011-12-29 MED ORDER — HYDROCODONE-ACETAMINOPHEN 5-325 MG PO TABS: 1.0000 | ORAL_TABLET | ORAL | Status: AC | PRN

## 2011-12-29 MED ORDER — ONDANSETRON 4 MG PO TBDP: 4.0000 mg | ORAL_TABLET | Freq: Three times a day (TID) | ORAL | Status: AC | PRN

## 2011-12-29 MED ORDER — IOHEXOL 300 MG/ML  SOLN
100.0000 mL | Freq: Once | INTRAMUSCULAR | Status: AC | PRN
  Administered 2011-12-29: 100 mL via INTRAVENOUS

## 2011-12-29 MED ORDER — NITROFURANTOIN MONOHYD MACRO 100 MG PO CAPS: 100.0000 mg | ORAL_CAPSULE | Freq: Two times a day (BID) | ORAL | Status: AC

## 2011-12-29 NOTE — ED Notes (Signed)
Pt back from ct scanner with transport

## 2011-12-29 NOTE — ED Provider Notes (Signed)
Medical screening examination/treatment/procedure(s) were performed by non-physician practitioner and as supervising physician I was immediately available for consultation/collaboration.  Ariana Cavenaugh, MD 12/29/11 2351 

## 2011-12-29 NOTE — ED Notes (Signed)
Pelvic cart at bedside. 

## 2011-12-29 NOTE — ED Notes (Signed)
Pt alert and oriented, with steady gait at time of discharge. Pt given discharge papers and papers explained. All questions answered and pt walked to discharge.  

## 2012-01-03 ENCOUNTER — Ambulatory Visit (INDEPENDENT_AMBULATORY_CARE_PROVIDER_SITE_OTHER): Payer: BC Managed Care – PPO | Admitting: Family Medicine

## 2012-01-03 VITALS — BP 110/76 | HR 84 | Temp 98.8°F | Resp 20 | Ht 64.5 in | Wt 115.0 lb

## 2012-01-03 DIAGNOSIS — R1011 Right upper quadrant pain: Secondary | ICD-10-CM

## 2012-01-03 DIAGNOSIS — M3390 Dermatopolymyositis, unspecified, organ involvement unspecified: Secondary | ICD-10-CM

## 2012-01-03 DIAGNOSIS — Z309 Encounter for contraceptive management, unspecified: Secondary | ICD-10-CM

## 2012-01-03 DIAGNOSIS — Z79899 Other long term (current) drug therapy: Secondary | ICD-10-CM

## 2012-01-03 DIAGNOSIS — IMO0001 Reserved for inherently not codable concepts without codable children: Secondary | ICD-10-CM

## 2012-01-03 LAB — POCT CBC
Granulocyte percent: 91.4 %G — AB (ref 37–80)
HCT, POC: 39.6 % (ref 37.7–47.9)
Hemoglobin: 12.3 g/dL (ref 12.2–16.2)
Lymph, poc: 1.3 (ref 0.6–3.4)
MCH, POC: 29.3 pg (ref 27–31.2)
MCHC: 31.1 g/dL — AB (ref 31.8–35.4)
MCV: 94.3 fL (ref 80–97)
MID (cbc): 0.5 (ref 0–0.9)
MPV: 5.9 fL (ref 0–99.8)
POC Granulocyte: 18.8 — AB (ref 2–6.9)
POC LYMPH PERCENT: 6.4 %L — AB (ref 10–50)
POC MID %: 2.2 %M (ref 0–12)
Platelet Count, POC: 584 10*3/uL — AB (ref 142–424)
RBC: 4.2 M/uL (ref 4.04–5.48)
RDW, POC: 18.8 %
WBC: 20.6 10*3/uL — AB (ref 4.6–10.2)

## 2012-01-03 LAB — POCT URINALYSIS DIPSTICK
Glucose, UA: NEGATIVE
Nitrite, UA: NEGATIVE
Protein, UA: 30
Spec Grav, UA: >=1.03
Urobilinogen, UA: 0.2
pH, UA: 6

## 2012-01-03 LAB — POCT SEDIMENTATION RATE: POCT SED RATE: 50 mm/hr — AB (ref 0–22)

## 2012-01-03 LAB — POCT UA - MICROSCOPIC ONLY
Casts, Ur, LPF, POC: NEGATIVE
Crystals, Ur, HPF, POC: NEGATIVE
Mucus, UA: NEGATIVE
Yeast, UA: NEGATIVE

## 2012-01-03 LAB — POCT URINE PREGNANCY: Preg Test, Ur: NEGATIVE

## 2012-01-03 MED ORDER — MEDROXYPROGESTERONE ACETATE 150 MG/ML IM SUSP
150.0000 mg | Freq: Once | INTRAMUSCULAR | Status: AC
  Administered 2012-01-03: 150 mg via INTRAMUSCULAR

## 2012-01-03 NOTE — Patient Instructions (Addendum)
Place depot medroxyprogesterone acetate injection patient instructions here. Place depot medroxyprogesterone acetate injection patient instructions here.   contraceptionContraception Choices Contraception (birth control) is the use of any methods or devices to prevent pregnancy. Below are some methods to help avoid pregnancy. HORMONAL METHODS   Contraceptive implant. This is a thin, plastic tube containing progesterone hormone. It does not contain estrogen hormone. Your caregiver inserts the tube in the inner part of the upper arm. The tube can remain in place for up to 3 years. After 3 years, the implant must be removed. The implant prevents the ovaries from releasing an egg (ovulation), thickens the cervical mucus which prevents sperm from entering the uterus, and thins the lining of the inside of the uterus.   Progesterone-only injections. These injections are given every 3 months by your caregiver to prevent pregnancy. This synthetic progesterone hormone stops the ovaries from releasing eggs. It also thickens cervical mucus and changes the uterine lining. This makes it harder for sperm to survive in the uterus.   Birth control pills. These pills contain estrogen and progesterone hormone. They work by stopping the egg from forming in the ovary (ovulation). Birth control pills are prescribed by a caregiver.Birth control pills can also be used to treat heavy periods.   Minipill. This type of birth control pill contains only the progesterone hormone. They are taken every day of each month and must be prescribed by your caregiver.   Birth control patch. The patch contains hormones similar to those in birth control pills. It must be changed once a week and is prescribed by a caregiver.   Vaginal ring. The ring contains hormones similar to those in birth control pills. It is left in the vagina for 3 weeks, removed for 1 week, and then a new one is put back in place. The patient must be comfortable  inserting and removing the ring from the vagina.A caregiver's prescription is necessary.   Emergency contraception. Emergency contraceptives prevent pregnancy after unprotected sexual intercourse. This pill can be taken right after sex or up to 5 days after unprotected sex. It is most effective the sooner you take the pills after having sexual intercourse. Emergency contraceptive pills are available without a prescription. Check with your pharmacist. Do not use emergency contraception as your only form of birth control.  BARRIER METHODS   Female condom. This is a thin sheath (latex or rubber) that is worn over the penis during sexual intercourse. It can be used with spermicide to increase effectiveness.   Female condom. This is a soft, loose-fitting sheath that is put into the vagina before sexual intercourse.   Diaphragm. This is a soft, latex, dome-shaped barrier that must be fitted by a caregiver. It is inserted into the vagina, along with a spermicidal jelly. It is inserted before intercourse. The diaphragm should be left in the vagina for 6 to 8 hours after intercourse.   Cervical cap. This is a round, soft, latex or plastic cup that fits over the cervix and must be fitted by a caregiver. The cap can be left in place for up to 48 hours after intercourse.   Sponge. This is a soft, circular piece of polyurethane foam. The sponge has spermicide in it. It is inserted into the vagina after wetting it and before sexual intercourse.   Spermicides. These are chemicals that kill or block sperm from entering the cervix and uterus. They come in the form of creams, jellies, suppositories, foam, or tablets. They do not require a  prescription. They are inserted into the vagina with an applicator before having sexual intercourse. The process must be repeated every time you have sexual intercourse.  INTRAUTERINE CONTRACEPTION  Intrauterine device (IUD). This is a T-shaped device that is put in a woman's uterus  during a menstrual period to prevent pregnancy. There are 2 types:   Copper IUD. This type of IUD is wrapped in copper wire and is placed inside the uterus. Copper makes the uterus and fallopian tubes produce a fluid that kills sperm. It can stay in place for 10 years.   Hormone IUD. This type of IUD contains the hormone progestin (synthetic progesterone). The hormone thickens the cervical mucus and prevents sperm from entering the uterus, and it also thins the uterine lining to prevent implantation of a fertilized egg. The hormone can weaken or kill the sperm that get into the uterus. It can stay in place for 5 years.  PERMANENT METHODS OF CONTRACEPTION  Female tubal ligation. This is when the woman's fallopian tubes are surgically sealed, tied, or blocked to prevent the egg from traveling to the uterus.   Female sterilization. This is when the female has the tubes that carry sperm tied off (vasectomy).This blocks sperm from entering the vagina during sexual intercourse. After the procedure, the man can still ejaculate fluid (semen).  NATURAL PLANNING METHODS  Natural family planning. This is not having sexual intercourse or using a barrier method (condom, diaphragm, cervical cap) on days the woman could become pregnant.   Calendar method. This is keeping track of the length of each menstrual cycle and identifying when you are fertile.   Ovulation method. This is avoiding sexual intercourse during ovulation.   Symptothermal method. This is avoiding sexual intercourse during ovulation, using a thermometer and ovulation symptoms.   Post-ovulation method. This is timing sexual intercourse after you have ovulated.  Regardless of which type or method of contraception you choose, it is important that you use condoms to protect against the transmission of sexually transmitted diseases (STDs). Talk with your caregiver about which form of contraception is most appropriate for you. Document Released:  05/24/2005 Document Revised: 05/13/2011 Document Reviewed: 09/30/2010 [facility] Patient Information 2012 [place], [place].

## 2012-01-03 NOTE — Progress Notes (Signed)
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  She was diagnosed last March after diffuse muscle pain, sweats, adenopathy, weakness, and fatigue.  She  Was initially  Told she had a virus.  Now Dr. Corliss Skains is treating her. Complains of RUQ pain:  Was seen in ED over the  Weekend and had an MRI last Tuesday which did not yield a diagnosis. Currently on her period  O:  NAD.  Moon facies Acneiform facial rash Tender RUQ with no HSM Chest:  Clear Heart:  Reg, no murmur CT ABDOMEN AND PELVIS WITH CONTRAST  Technique: Multidetector CT imaging of the abdomen and pelvis was  performed following the standard protocol during bolus  administration of intravenous contrast.  Contrast: 1 OMNIPAQUE IOHEXOL 300 MG/ML SOLN, OMNIPAQUE  IOHEXOL 300 MG/ML SOLN  Comparison: 12/28/2011; 09/24/2011  Findings: Lower lobe linear subsegmental atelectasis noted  dependently.  The liver appears unremarkable. Stable hypodense lesion along the  anterior spleen measures 0.6 cm in diameter, possibly a small  splenic hemangioma or cystic lesion.  Pancreas divisum is suspected, without dilatation of the dorsal  pancreatic duct. The pancreas appears otherwise unremarkable. The  adrenal glands appear normal.  The kidneys appear unremarkable, as do the proximal ureters.  The gallbladder appears unremarkable.  No pathologic retroperitoneal or porta hepatis adenopathy is  identified.  No pathologic pelvic adenopathy is identified.  Small mesenteric lymph nodes are not pathologically enlarged by  size criteria.  A cystic lesion of the right ovary measures 2.9 x 2.3 cm on image  65 of series 2. There may be another smaller cyst or follicle in  the right ovary. Uterus appears unremarkable. Trace free pelvic  fluid is probably physiologic.  The appendix is difficult to visualize. A thin candidate structure  extending from the tip of the cecum medially on images 43-46 of  series 5 does not appear  inflamed.  IMPRESSION:  1. Right ovarian cyst measures up to 2.9 cm.  2. Suspected pancreas divisum, without dilatation of the dorsal  pancreatic duct.  3. Poor visualization of the appendix, without localized  inflammation observed.  4. Dependent subsegmental atelectasis in the lower lobes.  5. Stable 0.6 cm hypodense lesion anteriorly in the spleen, likely  a small benign incidental lesion.  Results for orders placed in visit on 01/03/12  POCT CBC      Component Value Range   WBC 20.6 (*) 4.6 - 10.2 K/uL   Lymph, poc 1.3  0.6 - 3.4   POC LYMPH PERCENT 6.4 (*) 10 - 50 %L   MID (cbc) 0.5  0 - 0.9   POC MID % 2.2  0 - 12 %M   POC Granulocyte 18.8 (*) 2 - 6.9   Granulocyte percent 91.4 (*) 37 - 80 %G   RBC 4.20  4.04 - 5.48 M/uL   Hemoglobin 12.3  12.2 - 16.2 g/dL   HCT, POC 45.4  09.8 - 47.9 %   MCV 94.3  80 - 97 fL   MCH, POC 29.3  27 - 31.2 pg   MCHC 31.1 (*) 31.8 - 35.4 g/dL   RDW, POC 11.9     Platelet Count, POC 584 (*) 142 - 424 K/uL   MPV 5.9  0 - 99.8 fL  POCT UA - MICROSCOPIC ONLY      Component Value Range   WBC, Ur, HPF, POC 4-7     RBC, urine, microscopic tntc     Bacteria, U Microscopic 3+  Mucus, UA neg     Epithelial cells, urine per micros 2-3     Crystals, Ur, HPF, POC neg     Casts, Ur, LPF, POC neg     Yeast, UA neg    POCT URINALYSIS DIPSTICK      Component Value Range   Color, UA red     Clarity, UA cloudy     Glucose, UA neg     Bilirubin, UA small     Ketones, UA trace     Spec Grav, UA >=1.030     Blood, UA large     pH, UA 6.0     Protein, UA 30     Urobilinogen, UA 0.2     Nitrite, UA neg     Leukocytes, UA Trace    POCT URINE PREGNANCY      Component Value Range   Preg Test, Ur Negative       A: high risk meds, desire for contraception, RUQ pain Patient will need ongoing laboratory testing for as long as she is on a high risk medications. The right upper quadrant pain is a bit of a diffusing situation with patient having  frequent ER visits and no clear diagnosis. Seems most consistent with liver capsule stretching possibly secondary to the prednisone side effects on the liver.  1. Dermatomyositis  POCT CBC, POCT SEDIMENTATION RATE  2. High risk medications (not anticoagulants) long-term use  POCT CBC, Comprehensive metabolic panel, POCT UA - Microscopic Only, POCT urinalysis dipstick, POCT urine pregnancy  3. Right upper quadrant abdominal pain  US Abdomen Complete   4. Contraception:  Depoprovera

## 2012-01-04 LAB — COMPREHENSIVE METABOLIC PANEL
ALT: 56 U/L — ABNORMAL HIGH (ref 0–35)
AST: 22 U/L (ref 0–37)
Albumin: 4.5 g/dL (ref 3.5–5.2)
Alkaline Phosphatase: 81 U/L (ref 39–117)
BUN: 10 mg/dL (ref 6–23)
CO2: 29 mEq/L (ref 19–32)
Calcium: 9.9 mg/dL (ref 8.4–10.5)
Chloride: 100 mEq/L (ref 96–112)
Creat: 0.72 mg/dL (ref 0.50–1.10)
Glucose, Bld: 86 mg/dL (ref 70–99)
Potassium: 4.5 mEq/L (ref 3.5–5.3)
Sodium: 136 mEq/L (ref 135–145)
Total Bilirubin: 0.4 mg/dL (ref 0.3–1.2)
Total Protein: 7.7 g/dL (ref 6.0–8.3)

## 2012-01-31 ENCOUNTER — Ambulatory Visit (INDEPENDENT_AMBULATORY_CARE_PROVIDER_SITE_OTHER): Payer: BC Managed Care – PPO | Admitting: Family Medicine

## 2012-01-31 ENCOUNTER — Ambulatory Visit: Payer: BC Managed Care – PPO

## 2012-01-31 VITALS — BP 104/62 | HR 100 | Temp 98.2°F | Resp 16 | Ht 64.5 in | Wt 117.0 lb

## 2012-01-31 DIAGNOSIS — M25569 Pain in unspecified knee: Secondary | ICD-10-CM

## 2012-01-31 DIAGNOSIS — Z79899 Other long term (current) drug therapy: Secondary | ICD-10-CM

## 2012-01-31 DIAGNOSIS — M3390 Dermatopolymyositis, unspecified, organ involvement unspecified: Secondary | ICD-10-CM

## 2012-01-31 LAB — POCT URINE PREGNANCY: Preg Test, Ur: NEGATIVE

## 2012-01-31 NOTE — Progress Notes (Signed)
[facility] [address], [place] [address] [phone]  Date:  01/31/2012   Name:  Elizabeth Jensen   DOB:  [birth year]   MRN:  [id number]  PCP:  Provider Not In System    Chief Complaint: Other, Other and Possible Pregnancy   History of Present Illness:  Elizabeth Jensen is a 26 y.o. very pleasant female patient who presents with the following:  She is being treated by Dr. Corliss Skains for her auto- immune problem.  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  She would like to RTW at full duty now- she feels that she is ready and is excited to get back to work.  She works at an assisted living facility:  "I love it!"  She takes methotrexate once a week, and is finishing prednisone.  They plan to have her taper off of this soon.   Adanely feels that her strength is much better.  She has gained some weight due to prednisone, but she feels hopeful that she will be able to lose this.  She also has developed moon facies.   She wants to have a repeat pregnanacy test today.  She received depo- provera at her last visit but was told to have a repeat HCG at her follow- up.   Giara's other concerns today is her left knee- she had her muscle biopsy from her left medial quad a few months ago, and still have some tenderness and swelling of the left knee especially when it rains.  This does seem to be getting a little bit better with time. She had a steroid injection in the knee, but it did not help  Patient Active Problem List  Diagnosis  . Polymyositis    Past Medical History  Diagnosis Date  . Myositis   . Asthma     NO PROBLEMS NOW    Past Surgical History  Procedure Date  . Eye surgery   . Muscle biopsy 09/02/2011    Procedure: MUSCLE BIOPSY;  Surgeon: Velora Heckler, MD;  Location: [facility];  Service: General;  Laterality: Left;  Quadriceps muscle biospy    History  Substance Use Topics  .  Smoking status: Never Smoker   . Smokeless tobacco: Never Used  . Alcohol Use: No    Family History  Problem Relation Age of Onset  . Diabetes Other     Allergies  Allergen Reactions  . Penicillins Nausea Only    Medication list has been reviewed and updated.  Current Outpatient Prescriptions on File Prior to Visit  Medication Sig Dispense Refill  . folic acid (FOLVITE) 1 MG tablet Take 2 mg by mouth daily.      . medroxyPROGESTERone (DEPO-PROVERA) 150 MG/ML injection Inject 150 mg into the muscle every 3 (three) months.      . methotrexate (RHEUMATREX) 2.5 MG tablet Take 20 mg by mouth once a week. Takes on Friday!      . predniSONE (DELTASONE) 5 MG tablet Take 22.5 mg by mouth daily.        Review of Systems:  As per HPI- otherwise negative.   Physical Examination: Filed Vitals:   01/31/12 1253  BP: 104/62  Pulse: 100  Temp: 98.2 F (36.8 C)  Resp: 16   Filed Vitals:   01/31/12 1253  Height: 5' 4.5" (1.638 m)  Weight: 117 lb (53.071 kg)   Body mass index is 19.77 kg/(m^2).  Ideal Body Weight: Weight in (lb) to have BMI = 25: 147.6   GEN: WDWN, NAD, Non-toxic, A & O x 3 HEENT: Atraumatic, Normocephalic. Neck supple. No masses, No LAD.  Moon facies.  PEERL Ears and Nose: No external deformity. CV: RRR, No M/G/R. No JVD. No thrill. No extra heart sounds. PULM: CTA B, no wheezes, crackles, rhonchi. No retractions. No resp. distress. No accessory muscle use. EXTR: No c/c/e NEURO Normal gait. She has good strength and tone of her muscles.   PSYCH: Normally interactive. Conversant. Not depressed or anxious appearing.  Calm demeanor.  Left knee;  Minimal joint effusion, tenderness over scar from her muscle bx but no redness or evidence of infection.  Knee joint is stable and has full ROM.     [facility] reading (PRIMARY) by  Dr. Patsy Lager.  Left knee: negative.    LEFT KNEE - COMPLETE 4+ VIEW  Comparison: None.  Findings: No joint effusion or fracture. No degenerative  changes.  IMPRESSION: Negative.   Results for orders placed in visit on 01/31/12  POCT URINE PREGNANCY      Component Value Range   Preg Test, Ur Negative      Assessment and Plan: 1. High risk medication use  POCT urine pregnancy  2. Knee pain  DG Knee Complete 4 Views Left  3. Dermatomyositis     Dermatomyositis- getting better!  She is ready to RTW- gave her a note that she may work at full duty.  Follow- up with [place] as needed.  We hope that her knee will continue to get better- suspect that she has residual soreness from her muscle bx.    Abbe Amsterdam, MD

## 2012-02-24 LAB — POCT ERYTHROCYTE SEDIMENTATION RATE, NON-AUTOMATED: Sed Rate: 21 mm

## 2012-03-16 ENCOUNTER — Encounter: Payer: Self-pay | Admitting: Family Medicine

## 2012-03-16 ENCOUNTER — Ambulatory Visit (INDEPENDENT_AMBULATORY_CARE_PROVIDER_SITE_OTHER): Payer: BC Managed Care – PPO | Admitting: Family Medicine

## 2012-03-16 VITALS — BP 121/76 | HR 82 | Temp 99.2°F | Resp 16 | Ht 63.25 in | Wt 116.0 lb

## 2012-03-16 DIAGNOSIS — M3390 Dermatopolymyositis, unspecified, organ involvement unspecified: Secondary | ICD-10-CM

## 2012-03-16 DIAGNOSIS — H6120 Impacted cerumen, unspecified ear: Secondary | ICD-10-CM

## 2012-03-16 DIAGNOSIS — Z3041 Encounter for surveillance of contraceptive pills: Secondary | ICD-10-CM

## 2012-03-16 DIAGNOSIS — L2089 Other atopic dermatitis: Secondary | ICD-10-CM

## 2012-03-16 DIAGNOSIS — Z23 Encounter for immunization: Secondary | ICD-10-CM

## 2012-03-16 DIAGNOSIS — Z3049 Encounter for surveillance of other contraceptives: Secondary | ICD-10-CM

## 2012-03-16 DIAGNOSIS — B370 Candidal stomatitis: Secondary | ICD-10-CM

## 2012-03-16 DIAGNOSIS — H539 Unspecified visual disturbance: Secondary | ICD-10-CM

## 2012-03-16 LAB — COMPREHENSIVE METABOLIC PANEL
ALT: 24 U/L (ref 0–35)
AST: 14 U/L (ref 0–37)
Albumin: 4.6 g/dL (ref 3.5–5.2)
Alkaline Phosphatase: 64 U/L (ref 39–117)
BUN: 13 mg/dL (ref 6–23)
CO2: 27 mEq/L (ref 19–32)
Calcium: 10.8 mg/dL — ABNORMAL HIGH (ref 8.4–10.5)
Chloride: 102 mEq/L (ref 96–112)
Creat: 0.82 mg/dL (ref 0.50–1.10)
Glucose, Bld: 86 mg/dL (ref 70–99)
Potassium: 3.9 mEq/L (ref 3.5–5.3)
Sodium: 141 mEq/L (ref 135–145)
Total Bilirubin: 0.6 mg/dL (ref 0.3–1.2)
Total Protein: 7.6 g/dL (ref 6.0–8.3)

## 2012-03-16 LAB — POCT SEDIMENTATION RATE: POCT SED RATE: 23 mm/hr — AB (ref 0–22)

## 2012-03-16 LAB — RHEUMATOID FACTOR: Rhuematoid fact SerPl-aCnc: <10 IU/mL (ref <=14)

## 2012-03-16 LAB — TSH: TSH: 0.174 u[IU]/mL — ABNORMAL LOW (ref 0.350–4.500)

## 2012-03-16 MED ORDER — PNEUMOCOCCAL VAC POLYVALENT 25 MCG/0.5ML IJ INJ: 0.5000 mL | INJECTION | INTRAMUSCULAR | Status: DC

## 2012-03-16 MED ORDER — METHYLPREDNISOLONE ACETATE 80 MG/ML IJ SUSP
80.0000 mg | Freq: Once | INTRAMUSCULAR | Status: AC
  Administered 2012-03-16: 80 mg via INTRAMUSCULAR

## 2012-03-16 MED ORDER — FLUCONAZOLE 150 MG PO TABS: 150.0000 mg | ORAL_TABLET | Freq: Once | ORAL | Status: DC

## 2012-03-16 MED ORDER — MEDROXYPROGESTERONE ACETATE 150 MG/ML IM SUSP
150.0000 mg | Freq: Once | INTRAMUSCULAR | Status: AC
  Administered 2012-03-16: 150 mg via INTRAMUSCULAR

## 2012-03-16 NOTE — Progress Notes (Signed)
26 yo woman with dermatomyositis with worsening muscle pain and ear pain.  She has been reducing her prednisone and recently received flu shot last week.  Last visit with Dr. Corliss Skains, patient was feeling well.  Since then, tapering prednisone and muscle pains have been very uncomfortable x 2 months.  She works in assisted living.  She works with Dr. Corliss Skains  No hair loss  Objective:   Patient obviously uncomfortable. Diffuse muscle tenderness without effusions. No rash  Bilateral ear wax accumulation with normal TM's, normal neck exam, and normal oroph except for scattered white exudates overlying mild erythema  Assessment: 1. Dermatomyositis  pneumococcal 23 valent vaccine (PNU-IMMUNE) injection 0.5 mL, POCT SEDIMENTATION RATE, Comprehensive metabolic panel, Rheumatoid factor, methylPREDNISolone acetate (DEPO-MEDROL) injection 80 mg, TSH  2. Thrush, oral  fluconazole (DIFLUCAN) 150 MG tablet, TSH  3. Vision changes  Ambulatory referral to Ophthalmology   Spent 40 minutes with patient discussing her problems

## 2012-03-17 ENCOUNTER — Ambulatory Visit (INDEPENDENT_AMBULATORY_CARE_PROVIDER_SITE_OTHER): Payer: BC Managed Care – PPO | Admitting: Family Medicine

## 2012-03-17 ENCOUNTER — Telehealth: Payer: Self-pay

## 2012-03-17 ENCOUNTER — Encounter: Payer: Self-pay | Admitting: Family Medicine

## 2012-03-17 VITALS — BP 105/67 | HR 84 | Temp 98.2°F | Resp 16 | Ht 63.0 in | Wt 119.0 lb

## 2012-03-17 DIAGNOSIS — M3320 Polymyositis, organ involvement unspecified: Secondary | ICD-10-CM

## 2012-03-17 DIAGNOSIS — M3313 Other dermatomyositis without myopathy: Secondary | ICD-10-CM

## 2012-03-17 DIAGNOSIS — M3390 Dermatopolymyositis, unspecified, organ involvement unspecified: Secondary | ICD-10-CM

## 2012-03-17 MED ORDER — GABAPENTIN 300 MG PO CAPS: 300.0000 mg | ORAL_CAPSULE | Freq: Three times a day (TID) | ORAL | Status: DC

## 2012-03-17 MED ORDER — METHOTREXATE 2.5 MG PO TABS: 20.0000 mg | ORAL_TABLET | ORAL | Status: DC

## 2012-03-17 NOTE — Telephone Encounter (Signed)
Pt calling for lab results, please review labs and I will call patient.

## 2012-03-17 NOTE — Patient Instructions (Signed)
26 yo with polymyositis.  She was seen yesterday and given depomedrol 80 yesterday. She continues to have pain.  Tramadol doesn't help, simply makes her nauseated.  Hydrocodone also is not effective.  Objective: Results for orders placed in visit on 03/16/12 POCT SEDIMENTATION RATE     Component Value Range  POCT SED RATE 23 (*) 0 - 22 mm/hr COMPREHENSIVE METABOLIC PANEL     Component Value Range  Sodium 141  135 - 145 mEq/L  Potassium 3.9  3.5 - 5.3 mEq/L  Chloride 102  96 - 112 mEq/L  CO2 27  19 - 32 mEq/L  Glucose, Bld 86  70 - 99 mg/dL  BUN 13  6 - 23 mg/dL  Creat 1.61  0.96 - 0.45 mg/dL  Total Bilirubin 0.6  0.3 - 1.2 mg/dL  Alkaline Phosphatase 64  39 - 117 U/L  AST 14  0 - 37 U/L  ALT 24  0 - 35 U/L  Total Protein 7.6  6.0 - 8.3 g/dL  Albumin 4.6  3.5 - 5.2 g/dL  Calcium 40.9 (*) 8.4 - 10.5 mg/dL RHEUMATOID FACTOR     Component Value Range  Rheumatoid Factor <10  <=14 IU/mL TSH     Component Value Range  TSH 0.174 (*) 0.350 - 4.500 uIU/mL  Assessment:  Continued pain despite relatively normal labs.  Plan:   1. Polymyositis  gabapentin (NEURONTIN) 300 MG capsule I am hopeful that the depomedrol shot given yesterday will help with the inflammation over the weekend. Note given to be out of work over weekend.

## 2012-03-17 NOTE — Telephone Encounter (Signed)
Pt would like to know if her results were in please call at [phone]

## 2012-03-17 NOTE — Progress Notes (Signed)
26 yo with polymyositis.  She was seen yesterday and given depomedrol 80 yesterday. She continues to have pain.  Tramadol doesn't help, simply makes her nauseated.  Hydrocodone also is not effective.  Objective: Results for orders placed in visit on 03/16/12  POCT SEDIMENTATION RATE      Component Value Range   POCT SED RATE 23 (*) 0 - 22 mm/hr  COMPREHENSIVE METABOLIC PANEL      Component Value Range   Sodium 141  135 - 145 mEq/L   Potassium 3.9  3.5 - 5.3 mEq/L   Chloride 102  96 - 112 mEq/L   CO2 27  19 - 32 mEq/L   Glucose, Bld 86  70 - 99 mg/dL   BUN 13  6 - 23 mg/dL   Creat 4.09  8.11 - 9.14 mg/dL   Total Bilirubin 0.6  0.3 - 1.2 mg/dL   Alkaline Phosphatase 64  39 - 117 U/L   AST 14  0 - 37 U/L   ALT 24  0 - 35 U/L   Total Protein 7.6  6.0 - 8.3 g/dL   Albumin 4.6  3.5 - 5.2 g/dL   Calcium 78.2 (*) 8.4 - 10.5 mg/dL  RHEUMATOID FACTOR      Component Value Range   Rheumatoid Factor <10  <=14 IU/mL  TSH      Component Value Range   TSH 0.174 (*) 0.350 - 4.500 uIU/mL   Assessment:  Continued pain despite relatively normal labs.  Plan:   1. Polymyositis  gabapentin (NEURONTIN) 300 MG capsule

## 2012-04-13 ENCOUNTER — Ambulatory Visit (INDEPENDENT_AMBULATORY_CARE_PROVIDER_SITE_OTHER): Payer: BC Managed Care – PPO | Admitting: Physician Assistant

## 2012-04-13 ENCOUNTER — Encounter: Payer: Self-pay | Admitting: Physician Assistant

## 2012-04-13 ENCOUNTER — Ambulatory Visit: Payer: BC Managed Care – PPO | Admitting: Family Medicine

## 2012-04-13 VITALS — BP 118/76 | HR 86 | Temp 99.0°F | Resp 16 | Ht 63.0 in | Wt 120.2 lb

## 2012-04-13 DIAGNOSIS — Z7251 High risk heterosexual behavior: Secondary | ICD-10-CM

## 2012-04-13 DIAGNOSIS — N760 Acute vaginitis: Secondary | ICD-10-CM

## 2012-04-13 DIAGNOSIS — R300 Dysuria: Secondary | ICD-10-CM

## 2012-04-13 DIAGNOSIS — Z01419 Encounter for gynecological examination (general) (routine) without abnormal findings: Secondary | ICD-10-CM

## 2012-04-13 LAB — POCT UA - MICROSCOPIC ONLY
Casts, Ur, LPF, POC: NEGATIVE
Crystals, Ur, HPF, POC: NEGATIVE
Yeast, UA: NEGATIVE

## 2012-04-13 LAB — POCT WET PREP WITH KOH
KOH Prep POC: NEGATIVE
Trichomonas, UA: NEGATIVE

## 2012-04-13 LAB — POCT URINALYSIS DIPSTICK
Spec Grav, UA: >=1.03
Urobilinogen, UA: 0.2

## 2012-04-13 LAB — HEPATITIS C ANTIBODY: HCV Ab: NEGATIVE

## 2012-04-13 MED ORDER — METRONIDAZOLE 500 MG PO TABS: 500.0000 mg | ORAL_TABLET | Freq: Two times a day (BID) | ORAL | Status: DC

## 2012-04-13 NOTE — Progress Notes (Signed)
[address] [place] [address]   Phone [phone]  Subjective:    Patient ID: Elizabeth Jensen, female    DOB: 1985/10/28, 26 y.o.   MRN: [id number]  HPI Pt presents to clinic for yearly pap smear.  She has not had one in the last year.  She has never had an abnormal test.  She is currently not sexually active.  She is on Depo provera and is still having breakthrough bleeding.  She is also having dysuria without vaginal symptoms.  She is not having urinary urgency or frequency. She does SBE but has found no areas of concern.  She has recently been diagnosed with dermatomyositis and is seeing rheumatologist.  She was put on gabapentin for the burning feeling in her muscles but was unable to take it at night because she was to dizzy during the day.  She is still in a lot of pain and would like to have something for it.  She has an appt with Dr. Corliss Skains tomorrow.  Review of Systems  Constitutional: Negative for fever and chills.  Genitourinary: Positive for dysuria. Negative for urgency, frequency, vaginal discharge and dyspareunia.  Musculoskeletal: Positive for myalgias. Negative for arthralgias.       Objective:   Physical Exam  Vitals reviewed. Constitutional: She is oriented to person, place, and time. She appears well-developed and well-nourished.  HENT:  Head: Normocephalic and atraumatic.  Right Ear: External ear normal.  Left Ear: External ear normal.  Nose: Nose normal.  Mouth/Throat: Oropharynx is clear and moist.  Eyes: Conjunctivae normal are normal.  Neck: Neck supple.  Cardiovascular: Normal rate, regular rhythm and normal heart sounds.   No murmur heard. Pulmonary/Chest: Effort normal.  Abdominal: Soft. Bowel sounds are normal.  Genitourinary: Vagina normal and uterus normal. No breast swelling, tenderness, discharge or bleeding. Pelvic exam was performed with patient supine. No labial fusion. There is no rash, tenderness, lesion or injury on the right labia.  There is no rash, tenderness, lesion or injury on the left labia. Cervix exhibits no motion tenderness, no discharge and no friability. Right adnexum displays no mass, no tenderness and no fullness. Left adnexum displays no mass, no tenderness and no fullness. No erythema, tenderness or bleeding around the vagina. No foreign body around the vagina. No signs of injury around the vagina. No vaginal discharge found.  Neurological: She is alert and oriented to person, place, and time.  Skin: Skin is warm and dry.       Feels tight and looks swollen  Psychiatric: She has a normal mood and affect. Her behavior is normal. Judgment and thought content normal.   Results for orders placed in visit on 04/13/12  POCT URINALYSIS DIPSTICK      Component Value Range   Color, UA yellow     Clarity, UA slightly cloudy     Glucose, UA neg     Bilirubin, UA neg     Ketones, UA neg     Spec Grav, UA >=1.030     Blood, UA large     pH, UA 6.5     Protein, UA 30mg      Urobilinogen, UA 0.2     Nitrite, UA neg     Leukocytes, UA small (1+)    POCT WET PREP WITH KOH      Component Value Range   Trichomonas, UA Negative     Clue Cells Wet Prep HPF POC 50%     Epithelial Wet Prep HPF POC 1-6  Yeast Wet Prep HPF POC neg     Bacteria Wet Prep HPF POC 2+     RBC Wet Prep HPF POC 15-25     WBC Wet Prep HPF POC 5-10     KOH Prep POC Negative    POCT UA - MICROSCOPIC ONLY      Component Value Range   WBC, Ur, HPF, POC 20-30     RBC, urine, microscopic 0-1     Bacteria, U Microscopic trace     Mucus, UA neg     Epithelial cells, urine per micros 0-4     Crystals, Ur, HPF, POC neg     Casts, Ur, LPF, POC neg     Yeast, UA neg            Assessment & Plan:   1. Routine gynecological examination  Pap IG, CT/NG w/ reflex HPV when ASC-U  2. High risk sexual behavior  HSV 2 antibody, IgG, Hepatitis B surface antibody, Hepatitis B surface antigen, Hepatitis C antibody, HIV antibody, RPR  3. Dysuria   POCT urinalysis dipstick, POCT Wet Prep with KOH, POCT UA - Microscopic Only, Urine culture  4. BV (bacterial vaginosis)  metroNIDAZOLE (FLAGYL) 500 MG tablet   Will send urine for cx because of WBCs.  Pt to push fluids.  Will d/w rheumatologist taking flagyl with methotrexate at her appt tomorrow.  Also d/w pt that Rheumatologist should be treating her pain from her condition.  I did suggest maybe try a smaller dose of neurontin at night and a slower titration or maybe lyrica to see if less side effects, she will talk to rheumatologist tomorrow.  Pt will continue depo and it can be given for 1 year.

## 2012-04-14 LAB — RPR

## 2012-04-14 LAB — HSV 2 ANTIBODY, IGG: HSV 2 Glycoprotein G Ab, IgG: <0.1 IV

## 2012-04-16 MED ORDER — AZITHROMYCIN 500 MG PO TABS: 500.0000 mg | ORAL_TABLET | Freq: Once | ORAL | Status: DC

## 2012-04-16 NOTE — Addendum Note (Signed)
Addended by: Morrell Riddle on: 04/16/2012 12:18 PM   Modules accepted: Orders

## 2012-04-17 LAB — PAP IG, CT-NG, RFX HPV ASCU: Chlamydia Probe Amp: POSITIVE — AB

## 2012-04-19 ENCOUNTER — Encounter: Payer: Self-pay | Admitting: Physician Assistant

## 2012-04-26 ENCOUNTER — Ambulatory Visit: Payer: BC Managed Care – PPO | Admitting: Family Medicine

## 2012-04-27 ENCOUNTER — Ambulatory Visit: Payer: BC Managed Care – PPO | Admitting: Family Medicine

## 2012-04-29 ENCOUNTER — Encounter: Payer: Self-pay | Admitting: Family Medicine

## 2012-04-29 DIAGNOSIS — M3390 Dermatopolymyositis, unspecified, organ involvement unspecified: Secondary | ICD-10-CM

## 2012-04-29 NOTE — Assessment & Plan Note (Signed)
Jo-positive. Per [facility] 04/14/12

## 2012-05-03 ENCOUNTER — Ambulatory Visit: Payer: BC Managed Care – PPO | Admitting: Family Medicine

## 2012-05-10 ENCOUNTER — Encounter: Payer: Self-pay | Admitting: Family Medicine

## 2012-05-10 ENCOUNTER — Ambulatory Visit (INDEPENDENT_AMBULATORY_CARE_PROVIDER_SITE_OTHER): Payer: BC Managed Care – PPO | Admitting: Family Medicine

## 2012-05-10 VITALS — BP 112/69 | HR 88 | Temp 98.1°F | Resp 16 | Ht 64.0 in | Wt 117.0 lb

## 2012-05-10 DIAGNOSIS — IMO0002 Reserved for concepts with insufficient information to code with codable children: Secondary | ICD-10-CM

## 2012-05-10 DIAGNOSIS — A749 Chlamydial infection, unspecified: Secondary | ICD-10-CM

## 2012-05-10 DIAGNOSIS — R8789 Other abnormal findings in specimens from female genital organs: Secondary | ICD-10-CM

## 2012-05-10 DIAGNOSIS — N898 Other specified noninflammatory disorders of vagina: Secondary | ICD-10-CM

## 2012-05-10 DIAGNOSIS — B977 Papillomavirus as the cause of diseases classified elsewhere: Secondary | ICD-10-CM

## 2012-05-10 NOTE — Progress Notes (Signed)
S:  Pt here today for test of cure for Chlamydia and for evaluation of rash on buttocks, onset yesterday. This is accompanied by some vaginal itching. She also wanted to review results of STD testing from prior visit. Her PAP result is abnormal;  she does not have a clear understanding about HPV and abnormal PAP or other needed evaluation. Chlamydia was treated w/ Azithromycin 1 gram dose; pt had no side effects. She was advised that this STD was [facility] to [facility]; she chose  not to alert her sexual partner about STD results.  ROS: Negative for weight or appetite change, diaphoresis, oral lesions, fever/chills, abd or pelvic pain, abnl vag bleeding, HA, dizziness, CNS changes.   O: Filed Vitals:   05/10/12 1022  BP: 112/69  Pulse: 88  Temp: 98.1 F (36.7 C)  Resp: 16   GEN: In NAD; WN,WD. HENT: [place]/AT; EOMI w/ clear conj/scl. Otherwise normal. COR: RRR. LUNGS: Normal resp rate and effort. GU: NEFG; flesh-colored papules equally distributed around perineum near vaginal orifice and on buttocks; no drainage from these lesions/ no tenderness noted. Vaginal canal with scant d/c, no erythema or bleeding. Cervix has few punctate       lesions but not friable. NEURO: A&O x 3; CNs intact. Nonfocal.   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   2. Chlamydia infection - treated in November.   3. Vaginal discharge  GC/Chlamydia Amp Probe, Genital    Pt will be seen at [facility]) tomorrow AM; her labs and PAP results will be faxed to that facility- FAX: [phone].

## 2012-05-10 NOTE — Patient Instructions (Addendum)
Human Papillomavirus Human papillomavirus (HPV) is the most common sexually transmitted infection (STI) and is highly contagious. HPV infections cause genital warts and cancers to the outlet of the womb (cervix), birth canal (vagina), opening of the birth canal (vulva), and anus. There are over 100 types of HPV. Four types of HPV are responsible for causing 70% of all cervical cancers. Ninety percent of anal cancers and genital warts are caused by HPV. Unless you have wart-like lesions in the throat or genital warts that you can see or feel, HPV usually does not cause symptoms. Therefore, people can be infected for long periods and pass it on to others without knowing it. HPV in pregnancy usually does not cause a problem for the mother or baby. If the mother has genital warts, the baby rarely gets infected. When the HPV infection is found to be pre-cancerous on the cervix, vagina, or vulva, the mother will be followed closely during the pregnancy. Any needed treatment will be done after the baby is born. CAUSES   Having unprotected sex. HPV can be spread by oral, vaginal, or anal sexual activity.  Having several sex partners.  Having a sex partner who has other sex partners.  Having or having had another sexually transmitted infection. SYMPTOMS   More than 90% of people carrying HPV cannot tell anything is wrong.  Wart-like lesions in the throat (from having oral sex).  Warts in the infected skin or mucous membranes.  Genital warts may itch, burn, or bleed.  Genital warts may be painful or bleed during sexual intercourse. DIAGNOSIS   Genital warts are easily seen with the naked eye.  Currently, there is no FDA-approved test to detect HPV in males.  In females, a Pap test can show cells which are infected with HPV.  In females, a scope can be used to view the cervix (colposcopy). A colposcopy can be performed if the pelvic exam or Pap test is abnormal.  In females, a sample of tissue  may be removed (biopsy) during the colposcopy. TREATMENT   Treatment of genital warts can include:  Podophyllin lotion or gel.  Bichloroacetic acid (BCA) or trichloroacetic acid (TCA).  Podofilox solution or gel.  Imiquimod cream.  Interferon injections.  Use of a probe to apply extreme cold (cryotherapy).  Application of an intense beam of light (laser treatment).  Use of a probe to apply extreme heat (electrocautery).  Surgery.  HPV of the cervix, vagina, or vulva can be treated with:  Cryotherapy.  Laser treatment.  Electrocautery.  Surgery. Your caregiver will follow you closely after you are treated. This is because the HPV can come back and may need treatment again. HOME CARE INSTRUCTIONS   Follow your caregiver's instructions regarding medications, Pap tests, and follow-up exams.  Do not touch or scratch the warts.  Do not treat genital warts with medication used for treating hand warts.  Tell your sex partner about your infection because he or she may also need treatment.  Do not have sex while you are being treated.  After treatment, use condoms during sex to prevent future infections.  Have only 1 sex partner.  Have a sex partner who does not have other sex partners.  Use over-the-counter creams for itching or irritation as directed by your caregiver.  Use over-the-counter or prescription medicines for pain, discomfort, or fever as directed by your caregiver.  Do not douche or use tampons during treatment of HPV. PREVENTION   Talk to your caregiver about getting the HPV   vaccines. These vaccines prevent some HPV infections and cancers. It is recommended that the vaccine be given to males and females between the age of 103 and 32 years old. It will not work if you already have HPV and it is not recommended for pregnant women. The vaccines are not recommended for pregnant women.  Call your caregiver if you think you are pregnant and have the HPV.  A  PAP test is done to screen for cervical cancer.  The first PAP test should be done at age 61.  Between ages 89 and 6, PAP tests are repeated every 2 years.  Beginning at age 25, you are advised to have a PAP test every 3 years as long as your past 3 PAP tests have been normal.  Some women have medical problems that increase the chance of getting cervical cancer. Talk to your caregiver about these problems. It is especially important to talk to your caregiver if a new problem develops soon after your last PAP test. In these cases, your caregiver may recommend more frequent screening and Pap tests.  The above recommendations are the same for women who have or have not gotten the vaccine for HPV (Human Papillomavirus).  If you had a hysterectomy for a problem that was not a cancer or a condition that could lead to cancer, then you no longer need Pap tests. However, even if you no longer need a PAP test, a regular exam is a good idea to make sure no other problems are starting.   If you are between ages 40 and 80, and you have had normal Pap tests going back 10 years, you no longer need Pap tests. However, even if you no longer need a PAP test, a regular exam is a good idea to make sure no other problems are starting.  If you have had past treatment for cervical cancer or a condition that could lead to cancer, you need Pap tests and screening for cancer for at least 20 years after your treatment.  If Pap tests have been discontinued, risk factors (such as a new sexual partner)need to be re-assessed to determine if screening should be resumed.  Some women may need screenings more often if they are at high risk for cervical cancer. SEEK MEDICAL CARE IF:   The treated skin becomes red, swollen or painful.  You have an oral temperature above 102 F (38.9 C).  You feel generally ill.  You feel lumps or pimple-like projections in and around your genital area.  You develop bleeding of the  vagina or the treatment area.  You develop painful sexual intercourse. Document Released: 08/14/2003 Document Revised: 08/16/2011 Document Reviewed: 08/03/2007 [facility] Patient Information 2013 [place], [place].    You have an abnormal PAP result which shows low-grade changes in the cells that were obtained on the PAP test. You are being referred to GYN for special procedure to get a closer look at your cervix under  Magnification. The rash on your bottom/perineum is probably related to the HPV infection. The GYN can evaluate this and discuss treatment with you.

## 2012-05-11 LAB — GC/CHLAMYDIA PROBE AMP: CT Probe RNA: NEGATIVE

## 2012-05-11 NOTE — Progress Notes (Signed)
Quick Note:  Please call pt and advise that the following labs are negative/ normal. She came in for test of cure for Chlamydia. She was treated and infection has been cleared. Please practice safe sex in future.   Copy to pt.  ______

## 2012-08-04 ENCOUNTER — Ambulatory Visit: Payer: BC Managed Care – PPO | Admitting: Family Medicine

## 2012-10-30 ENCOUNTER — Encounter: Payer: Self-pay | Admitting: Obstetrics & Gynecology

## 2012-12-14 ENCOUNTER — Ambulatory Visit: Payer: Self-pay | Admitting: Obstetrics & Gynecology

## 2012-12-28 ENCOUNTER — Encounter: Payer: Self-pay | Admitting: Obstetrics & Gynecology

## 2012-12-28 ENCOUNTER — Ambulatory Visit (INDEPENDENT_AMBULATORY_CARE_PROVIDER_SITE_OTHER): Payer: BC Managed Care – PPO | Admitting: Obstetrics & Gynecology

## 2012-12-28 VITALS — BP 126/85 | HR 82 | Temp 98.2°F | Ht 63.0 in | Wt 124.8 lb

## 2012-12-28 DIAGNOSIS — B081 Molluscum contagiosum: Secondary | ICD-10-CM

## 2012-12-28 NOTE — Progress Notes (Signed)
.   Subjective:     Elizabeth Jensen is a 27 y.o. female here for a follow up exam.  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 She would like to discuss/try different treatment options.   Personal health questionnaire reviewed: no.   Gynecologic History Patient's last menstrual period was 12/27/2012. Contraception: none Last Pap: 2014. Results were: abnormal - LSIL Last mammogram: N/A  Obstetric History OB History   Grav Para Term Preterm Abortions TAB SAB Ect Mult Living                   The following portions of the patient's history were reviewed and updated as appropriate: allergies, current medications, past family history, past medical history, past social history, past surgical history and problem list.  Review of Systems Pertinent items are noted in HPI.    Objective:     informed consent was obtained to curettes the lesions on the mons. A time out was performed confirming the patient's procedure and allergy status. The area was prepped with Betadine. Topical anesthetic was applied to the lesions. The base of the lesions were then infiltrated with 1% lidocaine. Multiple lesions were then curetted using an 18-gauge needle. Silver nitrate was then applied for hemostasis.  Assessment:   Molluscum contagiosum  Plan:   Return to curette the remaining lesions

## 2013-01-15 IMAGING — CT CT ABD-PELV W/ CM
2 of 4 series · 14 of 32 positions shown, 19 images · IV contrast (80ml omni 300)
Comparison: None

CLINICAL DATA: Evaluate for malignancy.

CT ABDOMEN AND PELVIS WITH CONTRAST
TECHNIQUE: Multidetector CT imaging of the abdomen and pelvis was
performed following the standard protocol during bolus
administration of intravenous contrast.
Contrast: 80mL OMNIPAQUE IOHEXOL 300 MG/ML  SOLN

[Series 2: routine abdomen · axial · 0.62mm/px · z∈[-417,-122]mm · 8 of 77 slices shown, 13 images]
[im 9/77  soft-tissue]
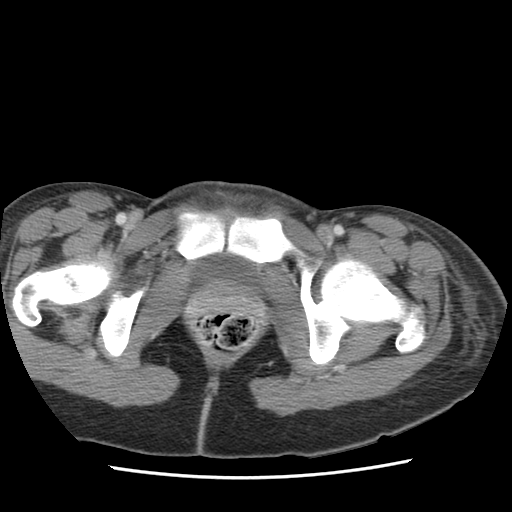
[im 9/77  bone]
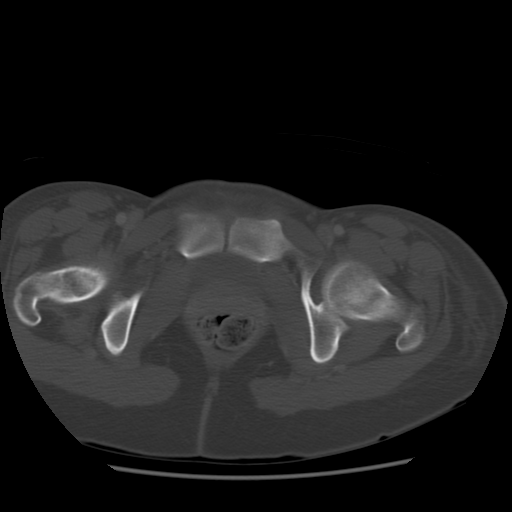
[im 17/77  soft-tissue]
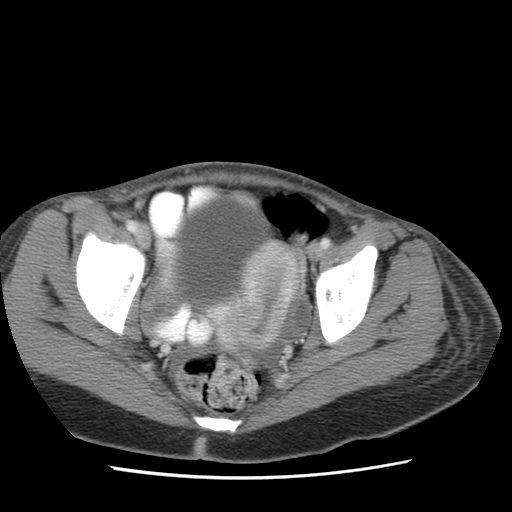
[im 26/77  soft-tissue]
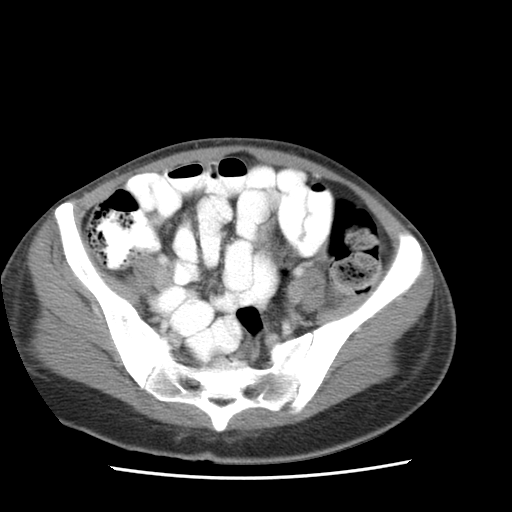
[im 34/77  soft-tissue]
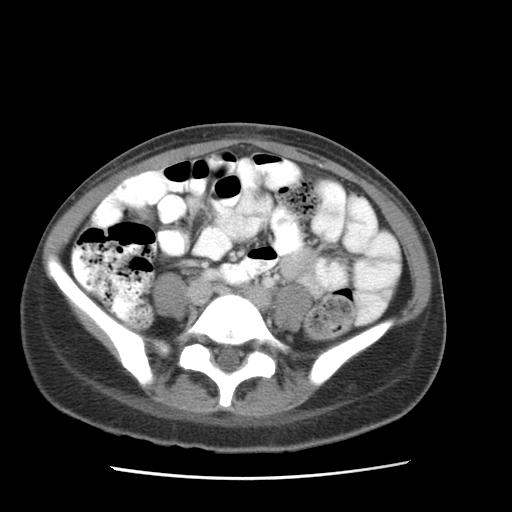
[im 43/77  soft-tissue]
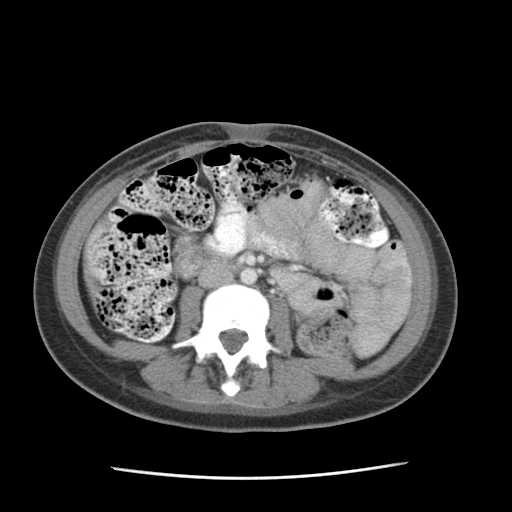
[im 43/77  lung]
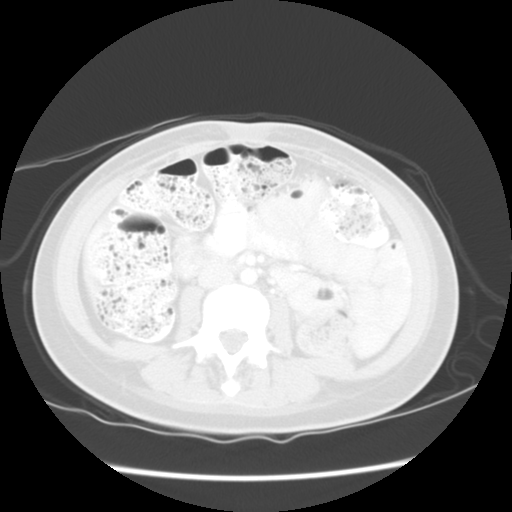
[im 51/77  soft-tissue]
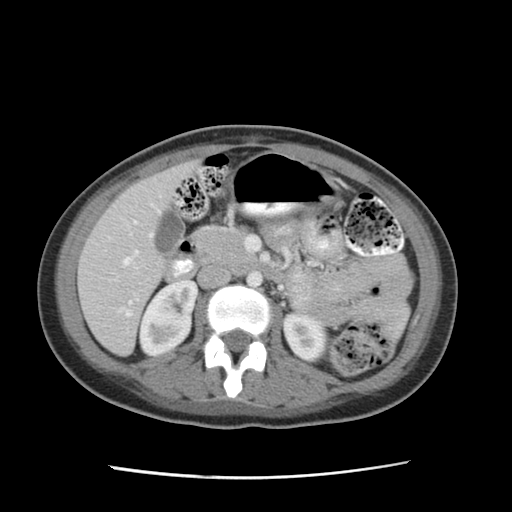
[im 51/77  lung]
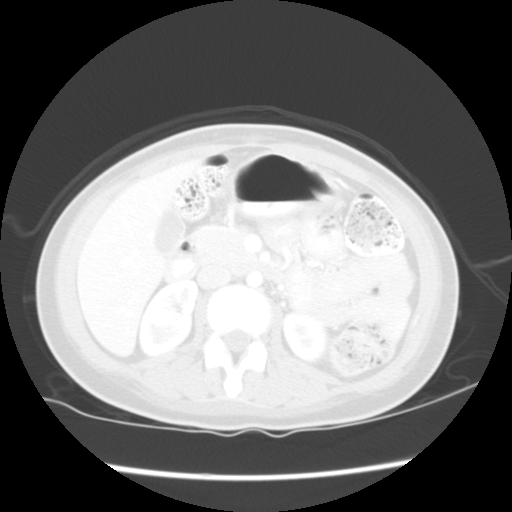
[im 60/77  soft-tissue]
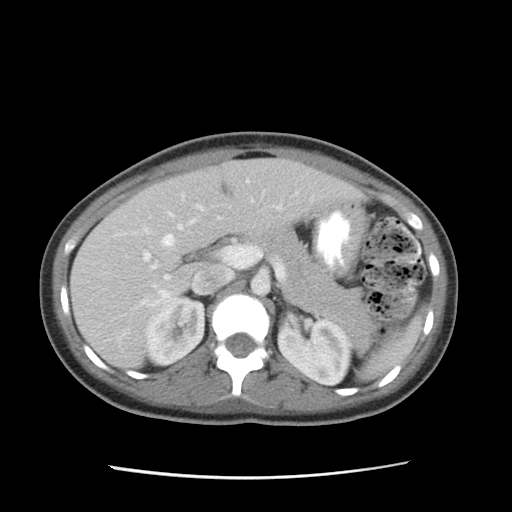
[im 60/77  lung]
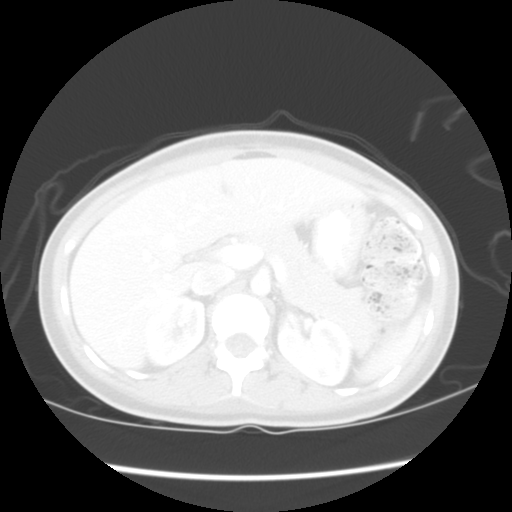
[im 68/77  soft-tissue]
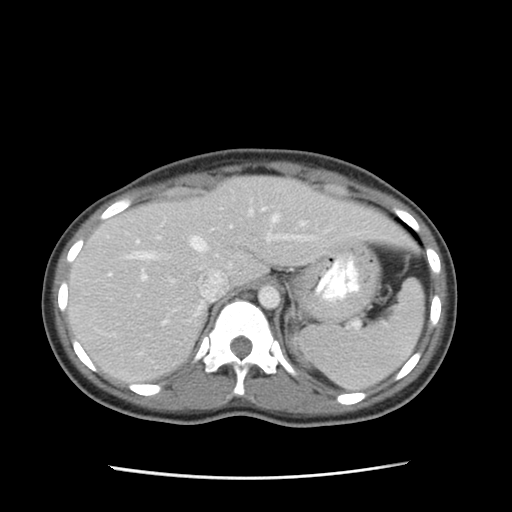
[im 68/77  lung]
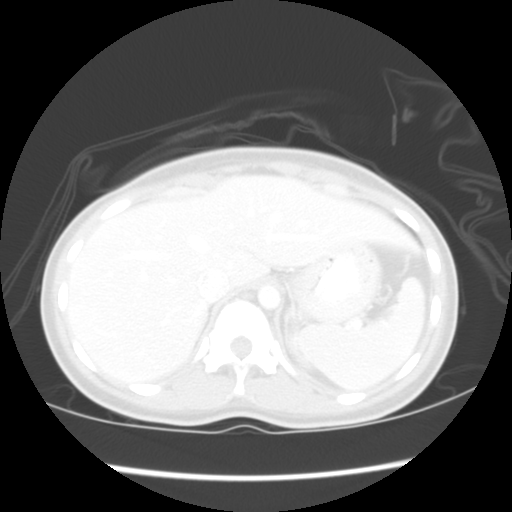

[Series 401: sag · sagittal · 0.76mm/px · 6 of 87 slices shown]
[im 9/87  soft-tissue]
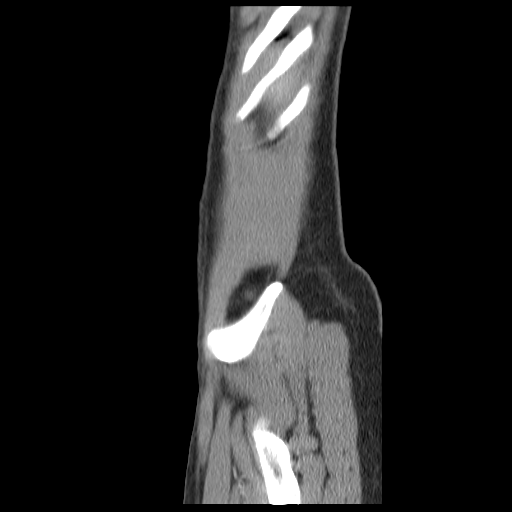
[im 18/87  soft-tissue]
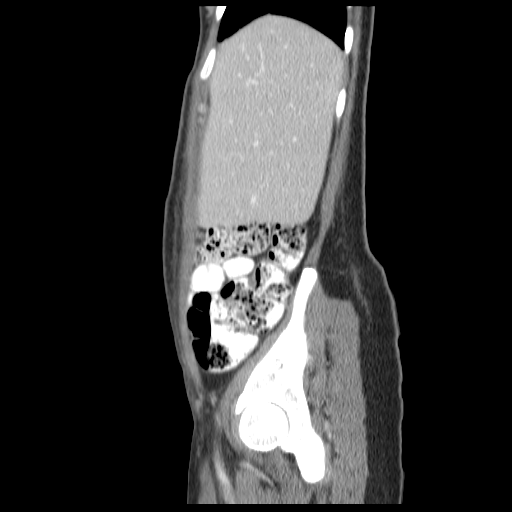
[im 26/87  soft-tissue]
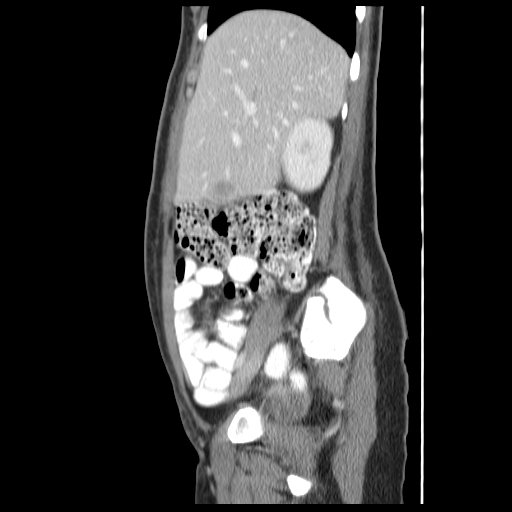
[im 35/87  soft-tissue]
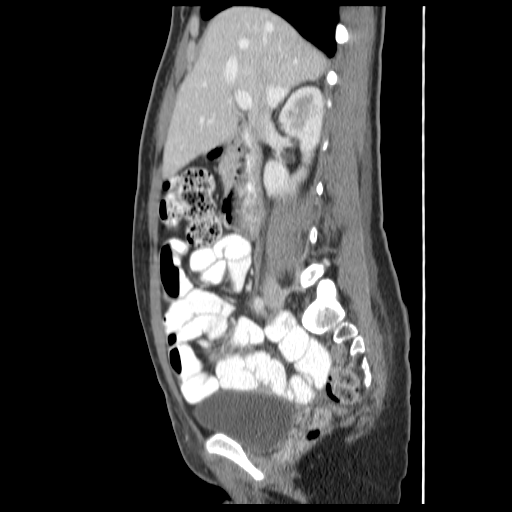
[im 52/87  soft-tissue]
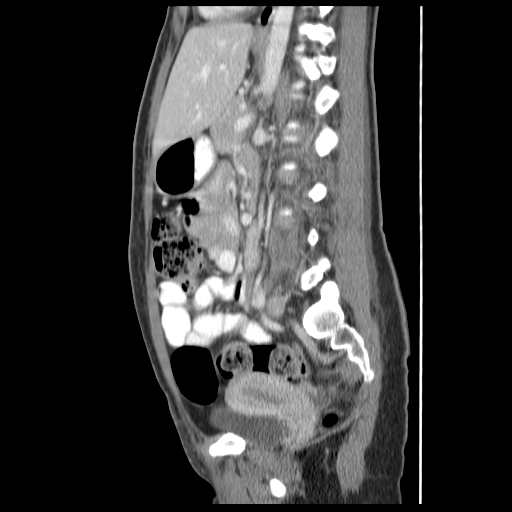
[im 61/87  soft-tissue]
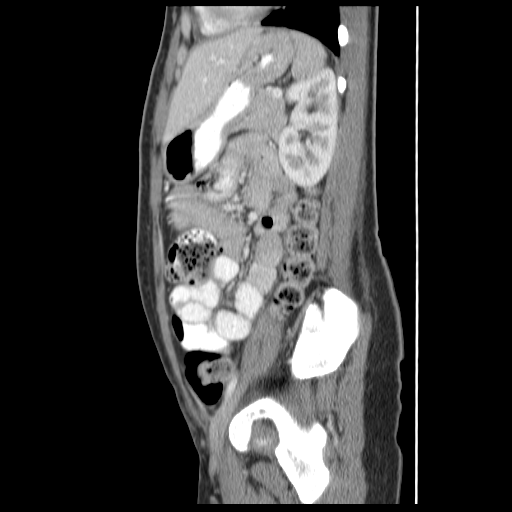

[14 of 32 positions shown; findings below may reference images not displayed]

FINDINGS: The lung bases appear clear.

There is no focal liver abnormality.

The gallbladder appears within normal limits.

The pancreas appears normal.  Small hypodense cyst is identified
arising from the anterior aspect of the splenic parenchyma
measuring 0.7 cm, image 17.

Both kidneys are normal.

The adrenal glands have a normal appearance

Normal appearance of the urinary bladder.  The uterus and the
adnexal structures have a normal physiologic appearance for
patient's age.

There are no enlarged upper abdominal lymph nodes.

There is no pelvic or inguinal adenopathy.

The stomach and the small bowel loops are within normal limits.
There is moderate amount of desiccated stool identified within the
colon.  Negative for obstruction.

Negative for free fluid or fluid collections within the abdomen or
pelvis.

Review of the visualized bony structures is unremarkable.
IMPRESSION: 1.  No evidence for mass or adenopathy within the abdomen or
pelvis.

## 2013-01-31 ENCOUNTER — Ambulatory Visit: Payer: BC Managed Care – PPO | Admitting: Obstetrics & Gynecology

## 2013-02-01 ENCOUNTER — Ambulatory Visit: Payer: BC Managed Care – PPO | Admitting: Obstetrics & Gynecology

## 2013-02-21 ENCOUNTER — Ambulatory Visit (INDEPENDENT_AMBULATORY_CARE_PROVIDER_SITE_OTHER): Payer: BC Managed Care – PPO | Admitting: Emergency Medicine

## 2013-02-21 ENCOUNTER — Ambulatory Visit: Payer: Self-pay

## 2013-02-21 VITALS — BP 114/58 | HR 90 | Temp 99.1°F | Resp 16 | Ht 64.0 in | Wt 122.6 lb

## 2013-02-21 DIAGNOSIS — J209 Acute bronchitis, unspecified: Secondary | ICD-10-CM

## 2013-02-21 DIAGNOSIS — R059 Cough, unspecified: Secondary | ICD-10-CM

## 2013-02-21 DIAGNOSIS — R05 Cough: Secondary | ICD-10-CM

## 2013-02-21 LAB — POCT CBC
Granulocyte percent: 75.9 %G (ref 37–80)
HCT, POC: 39.5 % (ref 37.7–47.9)
Lymph, poc: 1.5 (ref 0.6–3.4)
MCV: 95.4 fL (ref 80–97)
POC LYMPH PERCENT: 17.2 %L (ref 10–50)
RDW, POC: 18.2 %
WBC: 8.8 10*3/uL (ref 4.6–10.2)

## 2013-02-21 MED ORDER — LEVOFLOXACIN 500 MG PO TABS: 500.0000 mg | ORAL_TABLET | Freq: Every day | ORAL | Status: AC

## 2013-02-21 MED ORDER — HYDROCOD POLST-CHLORPHEN POLST 10-8 MG/5ML PO LQCR: 5.0000 mL | Freq: Two times a day (BID) | ORAL | Status: DC | PRN

## 2013-02-21 NOTE — Progress Notes (Signed)
[facility] [address], [place] [address] [phone]  Date:  02/21/2013   Name:  Elizabeth Jensen   DOB:  23-Mar-1986   MRN:  [id number]  PCP:  Provider Not In System    Chief Complaint: Sore Throat   History of Present Illness:  Elizabeth Jensen is a 27 y.o. very pleasant female patient who presents with the following:  On methotrexate and prednisone for dermatomyositis.  Now has cough, sore throat, nasal congestion and post nasal drip and positive sputum production.  Denies fever or chills, nausea or vomiting. No wheezing or shortness of breath.  Swallows all her sputum and post nasal drip.  Feels "bad" overall with myalgias and arthralgias.  No rash.  No improvement with over the counter medications or other home remedies. Denies other complaint or health concern today.   Patient Active Problem List   Diagnosis Date Noted  . 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 Dermatomyositis 08/31/2011    Past Medical History  Diagnosis Date  . Myositis   . Asthma     NO PROBLEMS NOW    Past Surgical History  Procedure Laterality Date  . Eye surgery    . Muscle biopsy  09/02/2011    Procedure: MUSCLE BIOPSY;  Surgeon: Velora Heckler, MD;  Location: [facility];  Service: General;  Laterality: Left;  Quadriceps muscle biospy    History  Substance Use Topics  . Smoking status: Never Smoker   . Smokeless tobacco: Never Used  . Alcohol Use: No    Family History  Problem Relation Age of Onset  . Diabetes Other     Allergies  Allergen Reactions  . Penicillins Nausea Only  . Tramadol Nausea Only    Medication list has been reviewed and updated.  Current Outpatient Prescriptions on File Prior to Visit  Medication Sig Dispense Refill  . corticotropin (ACTHAR HP) 80 UNIT/ML injectable gel Inject 80 Units into the muscle 2 (two) times a week.      . folic  acid (FOLVITE) 1 MG tablet Take 2 mg by mouth daily.      . Multiple Vitamin (MULTIVITAMIN) tablet Take 1 tablet by mouth daily.      . predniSONE (DELTASONE) 5 MG tablet Take 8 mg by mouth daily.       . pregabalin (LYRICA) 50 MG capsule Take 50 mg by mouth 2 (two) times daily.      Marland Kitchen VITAMIN D, CHOLECALCIFEROL, PO Take by mouth daily.      Marland Kitchen zolpidem (AMBIEN) 10 MG tablet Take 10 mg by mouth at bedtime as needed for sleep.      Marland Kitchen azithromycin (ZITHROMAX) 500 MG tablet Take 1 tablet (500 mg total) by mouth once.  2 tablet  0  . fluconazole (DIFLUCAN) 150 MG tablet Take 1 tablet (150 mg total) by mouth once.  1 tablet  0  . gabapentin (NEURONTIN) 300 MG capsule Take 1 capsule (300 mg total) by mouth 3 (three) times daily.  90 capsule  3  . medroxyPROGESTERone (DEPO-PROVERA) 150 MG/ML injection Inject 150 mg into the muscle every 3 (three) months.      . methotrexate (RHEUMATREX) 2.5 MG tablet Take 8 tablets (20 mg total) by mouth once a week. Takes on Friday!  4 tablet  3  . metroNIDAZOLE (FLAGYL) 500 MG tablet Take 1 tablet (500 mg total) by mouth  2 (two) times daily.  14 tablet  0   No current facility-administered medications on file prior to visit.    Review of Systems:  As per HPI, otherwise negative.    Physical Examination: Filed Vitals:   02/21/13 1111  BP: 114/58  Pulse: 90  Temp: 99.1 F (37.3 C)  Resp: 16   Filed Vitals:   02/21/13 1111  Height: 5\' 4"  (1.626 m)  Weight: 122 lb 9.6 oz (55.611 kg)   Body mass index is 21.03 kg/(m^2). Ideal Body Weight: Weight in (lb) to have BMI = 25: 145.3  GEN: WDWN, NAD, Non-toxic, A & O x 3 HEENT: Atraumatic, Normocephalic. Neck supple. No masses, No LAD. Ears and Nose: No external deformity. CV: RRR, No M/G/R. No JVD. No thrill. No extra heart sounds. PULM: CTA B, no wheezes, crackles, rhonchi. No retractions. No resp. distress. No accessory muscle use. ABD: S, NT, ND, +BS. No rebound. No HSM. EXTR: No c/c/e NEURO Normal  gait.  PSYCH: Normally interactive. Conversant. Not depressed or anxious appearing.  Calm demeanor.    Assessment and Plan: Cough Bronchitis Dermatomyositis levaquin tussionex  Signed,  Phillips Odor, MD   Results for orders placed in visit on 02/21/13  POCT CBC      Result Value Range   WBC 8.8  4.6 - 10.2 K/uL   Lymph, poc 1.5  0.6 - 3.4   POC LYMPH PERCENT 17.2  10 - 50 %L   MID (cbc) 0.6  0 - 0.9   POC MID % 6.9  0 - 12 %M   POC Granulocyte 6.7  2 - 6.9   Granulocyte percent 75.9  37 - 80 %G   RBC 4.14  4.04 - 5.48 M/uL   Hemoglobin 12.4  12.2 - 16.2 g/dL   HCT, POC 47.8  29.5 - 47.9 %   MCV 95.4  80 - 97 fL   MCH, POC 30.0  27 - 31.2 pg   MCHC 31.4 (*) 31.8 - 35.4 g/dL   RDW, POC 62.1     Platelet Count, POC 424  142 - 424 K/uL   MPV 6.4  0 - 99.8 fL  POCT RAPID STREP A (OFFICE)      Result Value Range   Rapid Strep A Screen Negative  Negative   [facility] reading (PRIMARY) by  Dr. Dareen Piano.  Negative chest.  a

## 2013-02-21 NOTE — Patient Instructions (Signed)

## 2013-03-08 ENCOUNTER — Ambulatory Visit: Payer: BC Managed Care – PPO | Admitting: Obstetrics & Gynecology

## 2013-03-08 ENCOUNTER — Encounter: Payer: Self-pay | Admitting: Obstetrics & Gynecology

## 2013-04-09 ENCOUNTER — Encounter: Payer: Self-pay | Admitting: Family Medicine

## 2013-04-20 IMAGING — US US ABDOMEN COMPLETE
1 series · 14 of 25 positions shown · non-contrast
Comparison: CT of the abdomen dated 09/24/2011

CLINICAL DATA: Right upper quadrant abdominal pain and
leukocytosis.

ABDOMEN ULTRASOUND
TECHNIQUE: Complete abdominal ultrasound examination was performed
including evaluation of the liver, gallbladder, bile ducts,
pancreas, kidneys, spleen, IVC, and abdominal aorta.

[Series 1: us abdomen complete · 0.23mm/px · 14 of 54 slices shown]
[im 1/54]
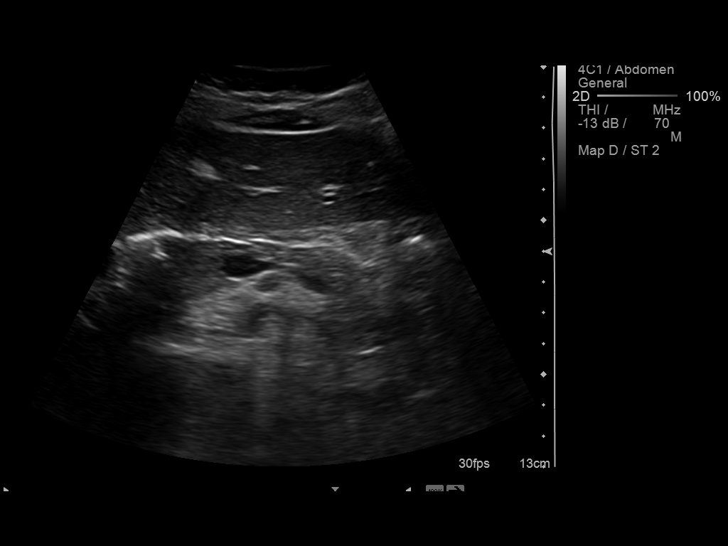
[im 5/54]
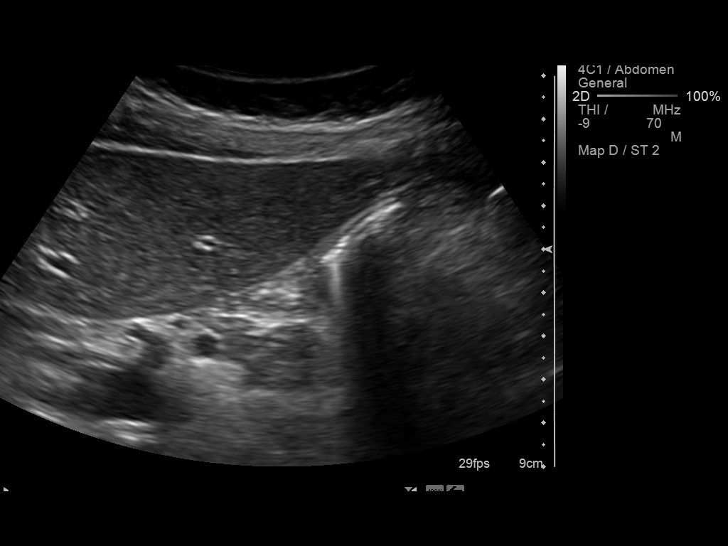
[im 9/54]
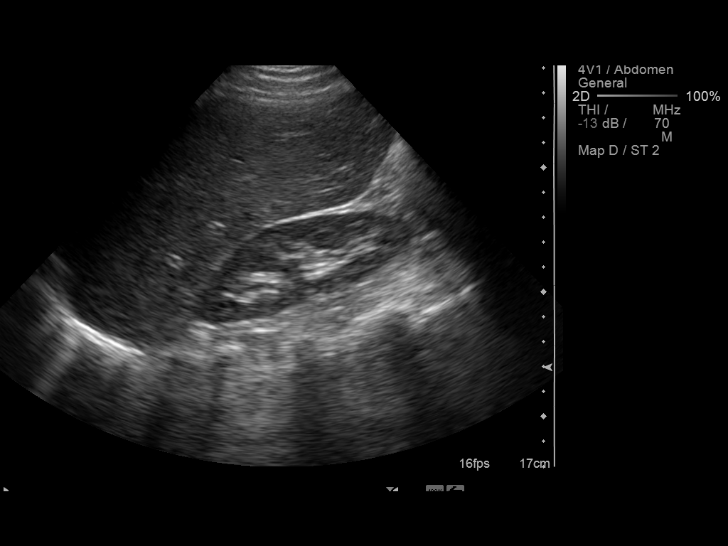
[im 14/54]
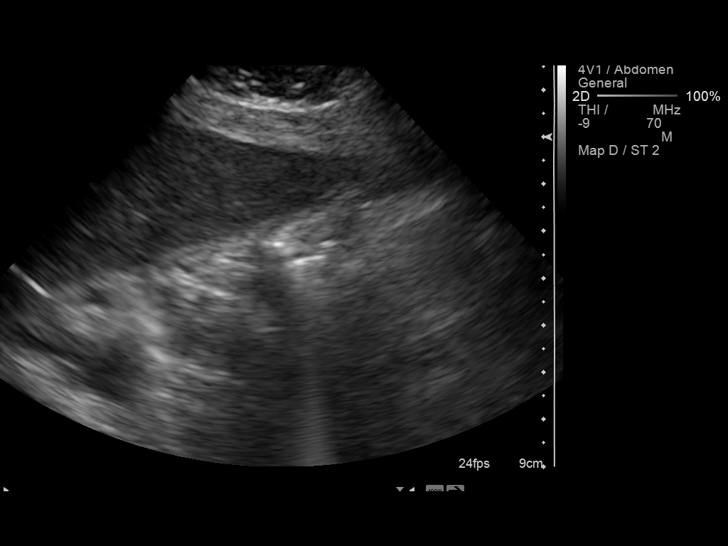
[im 18/54]
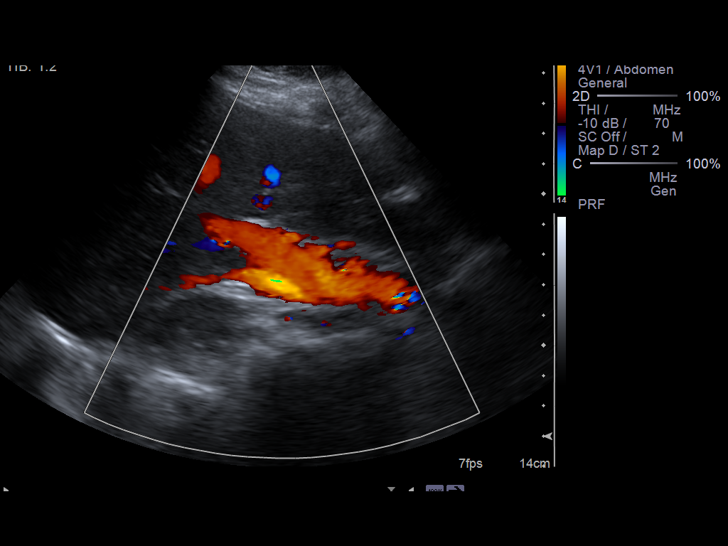
[im 20/54]
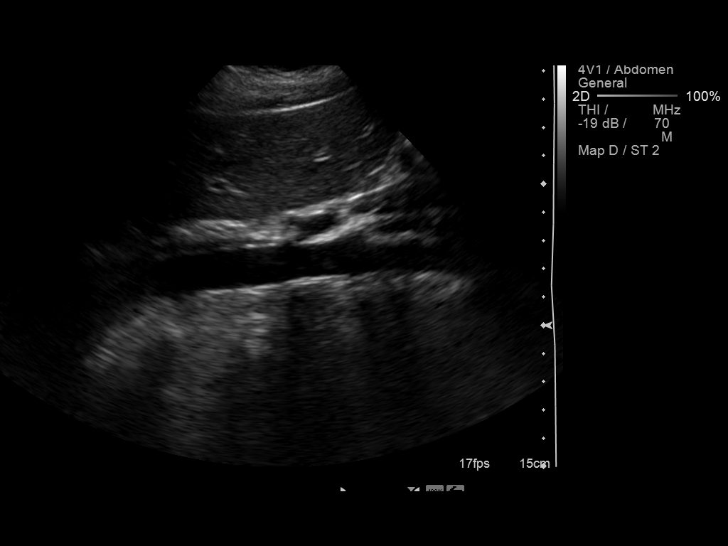
[im 25/54]
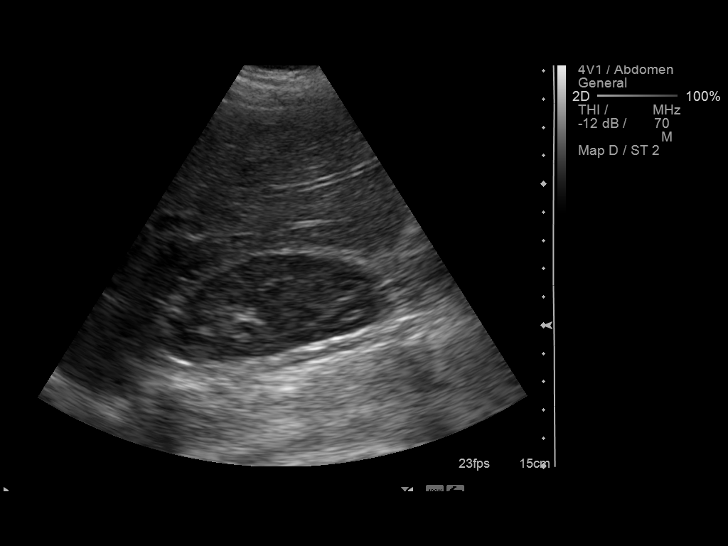
[im 29/54]
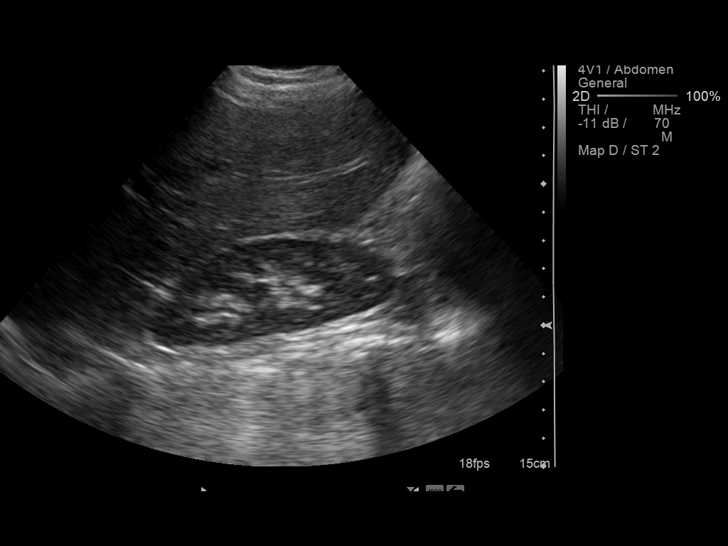
[im 34/54]
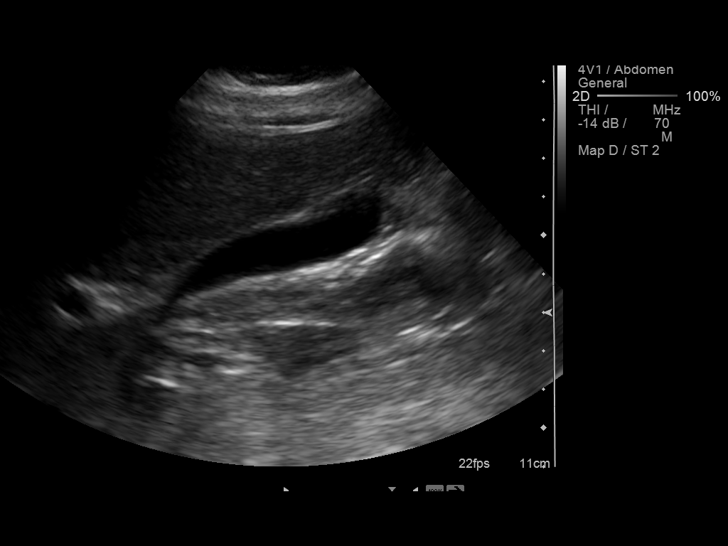
[im 36/54]
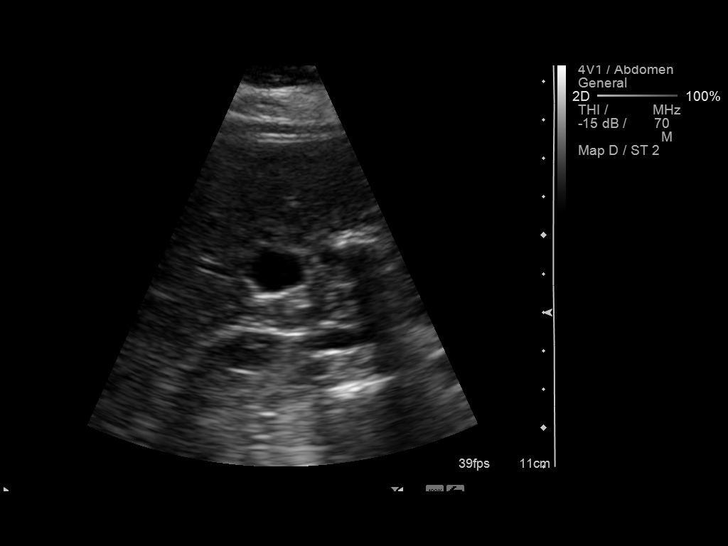
[im 40/54]
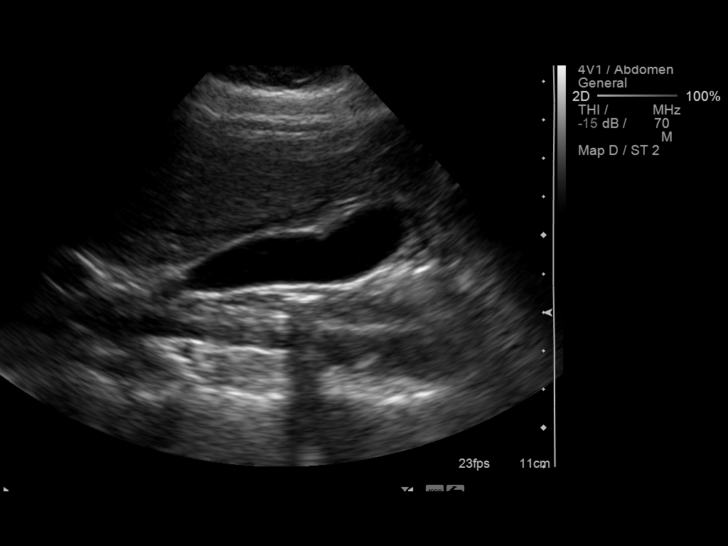
[im 45/54]
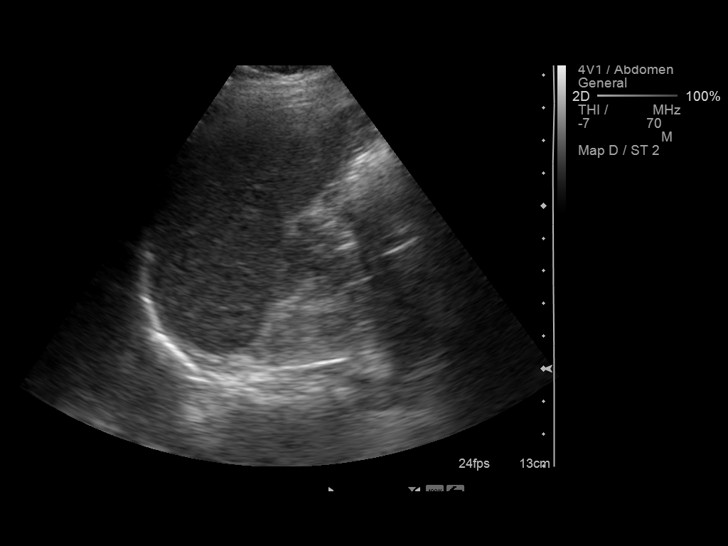
[im 49/54]
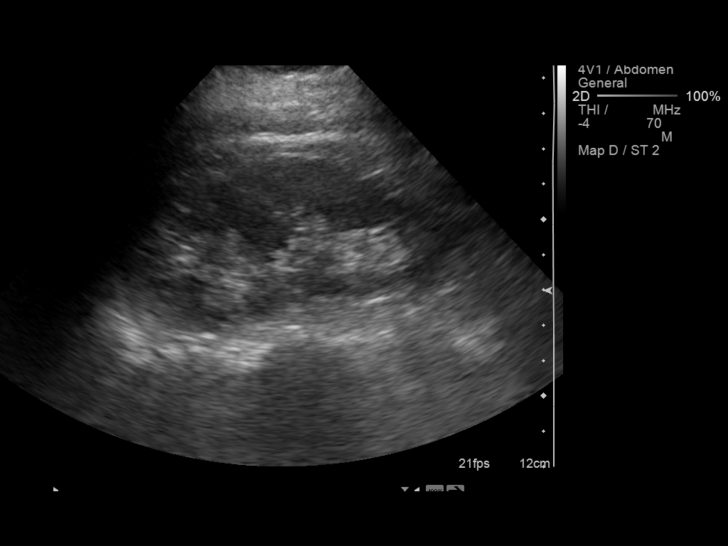
[im 54/54]
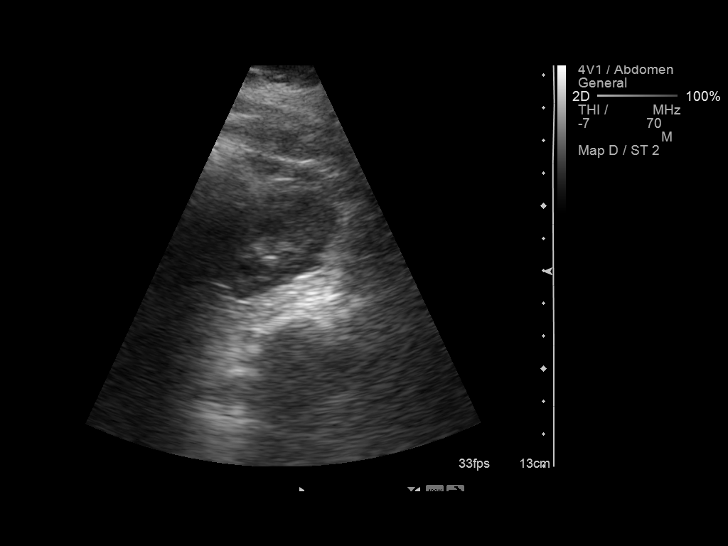

[14 of 25 positions shown; findings below may reference images not displayed]

FINDINGS: Gallbladder:  No shadowing gallstones or echogenic sludge.  No
gallbladder wall thickening or pericholecystic fluid.  Negative
sonographic Murphy's sign according to the ultrasound technologist.

Common Bile Duct:  Normal caliber of 4 mm.

Liver:  Normal size and echotexture without focal parenchymal
abnormality.  Patent portal vein with hepatopetal flow.

IVC:  Patent throughout its visualized course in the abdomen.

Pancreas:  Although the pancreas is difficult to visualize in its
entirety, no focal pancreatic abnormality is identified.

Spleen:  The spleen shows normal echotexture and size.

Kidneys:  Both kidneys are normal sonographic appearance without
evidence of hydronephrosis or focal lesion.  The right kidney
measures 9.5 cm and the left kidney 9.7 cm.
IMPRESSION: Normal abdominal ultrasound.

## 2013-06-28 ENCOUNTER — Ambulatory Visit: Payer: BC Managed Care – PPO | Admitting: Obstetrics & Gynecology

## 2013-07-25 ENCOUNTER — Encounter: Payer: Self-pay | Admitting: Obstetrics

## 2013-07-30 ENCOUNTER — Ambulatory Visit: Payer: Self-pay | Admitting: Obstetrics & Gynecology

## 2013-12-31 ENCOUNTER — Ambulatory Visit: Payer: Self-pay | Admitting: Family Medicine

## 2014-04-23 ENCOUNTER — Telehealth: Payer: Self-pay

## 2014-04-23 NOTE — Telephone Encounter (Signed)
Pt states she will come in on Saturday to see Dr. Joseph Art.

## 2014-04-23 NOTE — Telephone Encounter (Signed)
Pt of Dr. Carlean Jews sees a rheumatologist that will not prescribe her anything for pain, and wants her to decrease her prednisone dosage. She would like to know if she could have Dr. Carlean Jews regulate this, Please advise pt.

## 2014-04-27 ENCOUNTER — Ambulatory Visit (INDEPENDENT_AMBULATORY_CARE_PROVIDER_SITE_OTHER): Payer: BC Managed Care – PPO | Admitting: Family Medicine

## 2014-04-27 VITALS — BP 96/64 | HR 76 | Temp 98.4°F | Resp 16 | Ht 64.0 in | Wt 109.8 lb

## 2014-04-27 DIAGNOSIS — Z0000 Encounter for general adult medical examination without abnormal findings: Secondary | ICD-10-CM

## 2014-04-27 DIAGNOSIS — M2550 Pain in unspecified joint: Secondary | ICD-10-CM

## 2014-04-27 DIAGNOSIS — M3390 Dermatopolymyositis, unspecified, organ involvement unspecified: Secondary | ICD-10-CM

## 2014-04-27 DIAGNOSIS — B081 Molluscum contagiosum: Secondary | ICD-10-CM

## 2014-04-27 LAB — POCT CBC
Granulocyte percent: 69.8 %G (ref 37–80)
HCT, POC: 40.4 % (ref 37.7–47.9)
Hemoglobin: 13.2 g/dL (ref 12.2–16.2)
Lymph, poc: 1.5 (ref 0.6–3.4)
MCH, POC: 30.5 pg (ref 27–31.2)
MCHC: 32.7 g/dL (ref 31.8–35.4)
MCV: 32.2 fL — AB (ref 80–97)
MID (cbc): 0.3 (ref 0–0.9)
MPV: 6.5 fL (ref 0–99.8)
POC Granulocyte: 4.2 (ref 2–6.9)
POC LYMPH PERCENT: 25.1 %L (ref 10–50)
POC MID %: 5.1 %M (ref 0–12)
Platelet Count, POC: 313 10*3/uL (ref 142–424)
RBC: 4.33 M/uL (ref 4.04–5.48)
RDW, POC: 14.1 %
WBC: 6 10*3/uL (ref 4.6–10.2)

## 2014-04-27 LAB — LIPID PANEL
Cholesterol: 177 mg/dL (ref 0–200)
HDL: 55 mg/dL (ref >39)
LDL Cholesterol: 106 mg/dL — ABNORMAL HIGH (ref 0–99)
Total CHOL/HDL Ratio: 3.2 Ratio
Triglycerides: 78 mg/dL (ref <150)
VLDL: 16 mg/dL (ref 0–40)

## 2014-04-27 LAB — POCT URINALYSIS DIPSTICK
Bilirubin, UA: NEGATIVE
Blood, UA: NEGATIVE
Glucose, UA: NEGATIVE
Ketones, UA: NEGATIVE
Leukocytes, UA: NEGATIVE
Nitrite, UA: NEGATIVE
Protein, UA: NEGATIVE
Spec Grav, UA: >=1.03
Urobilinogen, UA: 0.2
pH, UA: 5

## 2014-04-27 LAB — COMPREHENSIVE METABOLIC PANEL
ALT: 9 U/L (ref 0–35)
AST: 14 U/L (ref 0–37)
Albumin: 4.5 g/dL (ref 3.5–5.2)
Alkaline Phosphatase: 44 U/L (ref 39–117)
BUN: 10 mg/dL (ref 6–23)
CO2: 22 mEq/L (ref 19–32)
Calcium: 9.6 mg/dL (ref 8.4–10.5)
Chloride: 102 mEq/L (ref 96–112)
Creat: 0.89 mg/dL (ref 0.50–1.10)
Glucose, Bld: 104 mg/dL — ABNORMAL HIGH (ref 70–99)
Potassium: 3.7 mEq/L (ref 3.5–5.3)
Sodium: 136 mEq/L (ref 135–145)
Total Bilirubin: 0.7 mg/dL (ref 0.2–1.2)
Total Protein: 7.4 g/dL (ref 6.0–8.3)

## 2014-04-27 LAB — POCT UA - MICROSCOPIC ONLY
Casts, Ur, LPF, POC: NEGATIVE
Crystals, Ur, HPF, POC: NEGATIVE
Yeast, UA: NEGATIVE

## 2014-04-27 LAB — TSH: TSH: 0.697 u[IU]/mL (ref 0.350–4.500)

## 2014-04-27 LAB — POCT SEDIMENTATION RATE: POCT SED RATE: 15 mm/hr (ref 0–22)

## 2014-04-27 LAB — CK: Total CK: 107 U/L (ref 7–177)

## 2014-04-27 MED ORDER — HYDROCODONE-ACETAMINOPHEN 10-325 MG PO TABS: 1.0000 | ORAL_TABLET | Freq: Three times a day (TID) | ORAL | Status: DC | PRN

## 2014-04-27 MED ORDER — CIMETIDINE 400 MG PO TABS: 400.0000 mg | ORAL_TABLET | Freq: Three times a day (TID) | ORAL | Status: DC

## 2014-04-27 NOTE — Patient Instructions (Addendum)
I am concerned that the dermatomyositis is flaring. Your reduction in methotrexate may need to be reevaluated.   Dermatomyositis Dermatomyositis is an acquired muscle disease. Dermatomyositis causes muscle weakness. It usually affects muscles that are closest to the trunk of your body. It can lead to difficulty rising from a sitting position, climbing stairs, lifting objects, or reaching overhead. The muscle weakness is usually accompanied by pain and swelling. CAUSES The cause of dermatomyositis is unknown. RISK FACTORS Females are more often affected than males. SIGNS AND SYMPTOMS  Symptoms of dermatomyositis may include:  A rash. This is often seen before the muscle weakness occurs. It is bluish-purple. It appears on your face, neck, shoulders, upper chest, elbows, knees, knuckles, and back. The rash may be itchy.  Swelling of your face and eyelids.  Hardened bumps of calcium deposits under your skin (especially in children).  Trouble swallowing, talking, and breathing.  Occasionally, the muscles ache and are tender to touch.  Constant tiredness (fatigue).  Weight loss.  Low-grade fever. DIAGNOSIS  Various tests may be done, including:  Blood tests. This can help determine if you have elevated levels of muscle enzymes such as creatine kinase and aldolase. Rises in these levels can indicate muscle damage. Blood tests can also detect autoantibodies associated with different symptoms of dermatomyositis.  Electromyogram (EMG) or electromyography. This test shows whether your muscles are responding to electrical signals from your nerves in the proper manner.  Muscle biopsy. For this test, a surgical procedure is done to remove a small sample of tissue from a weakened muscle. This tissue sample is then looked at under a microscope.  Skin biopsy. For this test, a small sample of skin is taken from an area with a rash. The sample is then examined under a microscope.  Chest X-ray.  This imaging test can check for signs of the type of lung damage that sometimes occurs with dermatomyositis.  MRI imaging. This test can be used to evaluate your muscles for signs of abnormal inflammation. TREATMENT  Treatment of dermatomyositis includes:  The use of prednisone and other drugs that suppress your immune system.  The use of immunoglobulin.  Physical therapy to prevent the thinning of your muscles (atrophy). HOME CARE INSTRUCTIONS  Wear protective clothing and high-protection sunscreen when you go outside. Areas affected by the rash are more sensitive to the sun.  Only take over-the-counter or prescription medicines as directed by your health care provider. SEEK MEDICAL CARE IF:  Your condition worsens.  You have unexplained muscle weakness.  You develop a new rash.  You have a fever and cough. SEEK IMMEDIATE MEDICAL CARE IF:  You have difficulty swallowing.  You have breathing problems. Document Released: 02/13/2002 Document Revised: 05/29/2013 Document Reviewed: 01/29/2013 [facility] Patient Information 2015 [place], [place]. This information is not intended to replace advice given to you by your health care provider. Make sure you discuss any questions you have with your health care provider.

## 2014-04-27 NOTE — Progress Notes (Signed)
Subjective:    Patient ID: Elizabeth Jensen, female    DOB: 05-21-1986, 28 y.o.   MRN: [id number] This chart was scribed for Dr. Robyn Haber by Steva Colder, ED Scribe. The patient was seen in room 13 at 9:35 AM.   Chief Complaint  Patient presents with  . Annual Exam    with pap    HPI Elizabeth Jensen is a 28 y.o. female with a medical hx of Dermatomyositis, HPV and abnormal pap who presents today complaining of annual exam with pap. She states that she is having associated symptoms of diarrhea, shoulder pain, and knee pain. She denies any other symptoms. Pt reports that her rheumatologist is not being nice to her. She states that she called and asked for pain medication and was informed that she is not to be in pain. Pt reports that she gets her labs done every 2 months and the next time is 12/15. She voices concern for the results of her last labs that were completed. 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 She voices that she has not had a Menstrual cycle since October. She states that she went to a Gynecologist and was informed that she has Molluscum contagiosum for 6 months on her buttocks and it is hard to cure it because her immune system is not up to par. She states that she lives alone.    Patient Active Problem List   Diagnosis Date Noted  . 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 Dermatomyositis 08/31/2011   Past Medical History  Diagnosis Date  . Myositis   . Asthma     NO PROBLEMS NOW  . Inflammatory arthritis   . Rheumatoid aortitis    Past Surgical History  Procedure Laterality Date  . Eye surgery    . Muscle biopsy  09/02/2011    Procedure: MUSCLE BIOPSY;  Surgeon: Earnstine Regal, MD;  Location: [place];  Service: General;  Laterality: Left;  Quadriceps muscle biospy   Allergies  Allergen  Reactions  . Penicillins Nausea Only  . Tramadol Nausea Only   Prior to Admission medications   Medication Sig Start Date End Date Taking? Authorizing Provider  folic acid (FOLVITE) 1 MG tablet Take 2 mg by mouth daily.   Yes Historical Provider, MD  methotrexate (RHEUMATREX) 2.5 MG tablet Take 8 tablets (20 mg total) by mouth once a week. Takes on Friday! 03/17/12  Yes Robyn Haber, MD  Multiple Vitamin (MULTIVITAMIN) tablet Take 1 tablet by mouth daily.   Yes Historical Provider, MD  predniSONE (DELTASONE) 5 MG tablet Take 8 mg by mouth daily.    Yes Historical Provider, MD  zolpidem (AMBIEN) 10 MG tablet Take 10 mg by mouth at bedtime as needed for sleep.   Yes Historical Provider, MD  chlorpheniramine-HYDROcodone (TUSSIONEX PENNKINETIC ER) 10-8 MG/5ML LQCR Take 5 mLs by mouth every 12 (twelve) hours as needed. 02/21/13   Roselee Culver, MD  corticotropin (ACTHAR HP) 80 UNIT/ML injectable gel Inject 80 Units into the muscle 2 (two) times a week.    Historical Provider, MD  fluconazole (DIFLUCAN) 150 MG tablet Take 1 tablet (150 mg total) by mouth once. 03/16/12   Robyn Haber, MD  gabapentin (NEURONTIN) 300 MG capsule Take 1 capsule (300 mg total) by mouth 3 (three) times daily. 03/17/12  Robyn Haber, MD  medroxyPROGESTERone (DEPO-PROVERA) 150 MG/ML injection Inject 150 mg into the muscle every 3 (three) months.    Historical Provider, MD  metroNIDAZOLE (FLAGYL) 500 MG tablet Take 1 tablet (500 mg total) by mouth 2 (two) times daily. 04/13/12   Mancel Bale, PA-C  pregabalin (LYRICA) 50 MG capsule Take 50 mg by mouth 2 (two) times daily.    Historical Provider, MD  PRESCRIPTION MEDICATION Otrexup 20 mg auto injectable taken once weekly on Friday.    Historical Provider, MD  VITAMIN D, CHOLECALCIFEROL, PO Take by mouth daily.    Historical Provider, MD      Review of Systems  Gastrointestinal: Positive for diarrhea.  All other systems reviewed and are negative.        Objective:   Physical Exam  Constitutional: She is oriented to person, place, and time. She appears well-developed and well-nourished. No distress.  HENT:  Head: Normocephalic and atraumatic.  Right Ear: Tympanic membrane normal.  Left Ear: Tympanic membrane normal.  Mouth/Throat: Oropharynx is clear and moist and mucous membranes are normal. No oral lesions.  Eyes: EOM are normal.  Tiny cataract bilaterally. Fundi normal  Neck: Neck supple. No tracheal deviation present.  Cardiovascular: Normal rate, regular rhythm and normal heart sounds.   Pulmonary/Chest: Effort normal and breath sounds normal. No respiratory distress. Right breast exhibits tenderness. Right breast exhibits no inverted nipple, no mass, no nipple discharge and no skin change. Left breast exhibits tenderness. Left breast exhibits no inverted nipple, no mass, no nipple discharge and no skin change. Breasts are symmetrical.  Genitourinary: Vagina normal and uterus normal. There is breast tenderness. No breast swelling, discharge or bleeding. Pelvic exam was performed with patient supine. No labial fusion. There is no rash, tenderness, lesion or injury on the right labia. There is no rash, tenderness, lesion or injury on the left labia. No erythema, tenderness or bleeding in the vagina. No foreign body around the vagina. No signs of injury around the vagina. No vaginal discharge found.  Cluster of umbilicated papules on the right gluteral area.   Musculoskeletal: Normal range of motion.  Neurological: She is alert and oriented to person, place, and time.  Skin: Skin is warm and dry.  Psychiatric: She has a normal mood and affect. Her behavior is normal.  Nursing note and vitals reviewed.  Results for orders placed or performed in visit on 04/27/14  POCT CBC  Result Value Ref Range   WBC 6.0 4.6 - 10.2 K/uL   Lymph, poc 1.5 0.6 - 3.4   POC LYMPH PERCENT 25.1 10 - 50 %L   MID (cbc) 0.3 0 - 0.9   POC MID % 5.1 0 - 12 %M    POC Granulocyte 4.2 2 - 6.9   Granulocyte percent 69.8 37 - 80 %G   RBC 4.33 4.04 - 5.48 M/uL   Hemoglobin 13.2 12.2 - 16.2 g/dL   HCT, POC 40.4 37.7 - 47.9 %   MCV 32.2 (A) 80 - 97 fL   MCH, POC 30.5 27 - 31.2 pg   MCHC 32.7 31.8 - 35.4 g/dL   RDW, POC 14.1 %   Platelet Count, POC 313 142 - 424 K/uL   MPV 6.5 0 - 99.8 fL  POCT urinalysis dipstick  Result Value Ref Range   Color, UA yellow    Clarity, UA hazy    Glucose, UA neg    Bilirubin, UA neg    Ketones, UA neg    Spec  Grav, UA >=1.030    Blood, UA neg    pH, UA 5.0    Protein, UA neg    Urobilinogen, UA 0.2    Nitrite, UA neg    Leukocytes, UA Negative   POCT UA - Microscopic Only  Result Value Ref Range   WBC, Ur, HPF, POC 1-2    RBC, urine, microscopic 0-2    Bacteria, U Microscopic trace    Mucus, UA trace    Epithelial cells, urine per micros 1-3    Crystals, Ur, HPF, POC neg    Casts, Ur, LPF, POC neg    Yeast, UA neg        BP 96/64 mmHg  Pulse 76  Temp(Src) 98.4 F (36.9 C) (Oral)  Resp 16  Ht 5\' 4"  (1.626 m)  Wt 109 lb 12.8 oz (49.805 kg)  BMI 18.84 kg/m2  SpO2 96%  LMP 03/08/2014  Assessment & Plan:  DIAGNOSTIC STUDIES: Oxygen Saturation is 96% on room air, normal by my interpretation.    COORDINATION OF CARE: 10:02 AM-Discussed treatment plan which includes blood work, pap, annual exam with pt at bedside and pt agreed to plan.  Annual physical exam - Plan: POCT CBC, POCT SEDIMENTATION RATE, POCT urinalysis dipstick, POCT UA - Microscopic Only, CK, Comprehensive metabolic panel, Lipid panel, TSH, Pap IG (Image Guided), Anti-Smith antibody  Dermatomyositis - Plan: POCT CBC, POCT SEDIMENTATION RATE, POCT urinalysis dipstick, POCT UA - Microscopic Only, CK, Comprehensive metabolic panel, Lipid panel, TSH, Pap IG (Image Guided), Anti-Smith antibody  Arthralgia - Plan: POCT CBC, POCT SEDIMENTATION RATE, POCT urinalysis dipstick, POCT UA - Microscopic Only, CK, Comprehensive metabolic panel, Lipid  panel, TSH, Pap IG (Image Guided), Anti-Smith antibody, HYDROcodone-acetaminophen (NORCO) 10-325 MG per tablet  Molluscum contagiosum - Plan: POCT CBC, POCT SEDIMENTATION RATE, POCT urinalysis dipstick, POCT UA - Microscopic Only, CK, Comprehensive metabolic panel, Lipid panel, TSH, Pap IG (Image Guided), Anti-Smith antibody, cimetidine (TAGAMET) 400 MG tablet    I personally performed the services described in this documentation, which was scribed in my presence. The recorded information has been reviewed and is accurate.

## 2014-04-28 ENCOUNTER — Telehealth: Payer: Self-pay | Admitting: *Deleted

## 2014-04-28 NOTE — Telephone Encounter (Signed)
I won't be able to call back until later this afternoon

## 2014-04-29 LAB — ANTI-SMITH ANTIBODY: ENA SM Ab Ser-aCnc: <1

## 2014-04-30 LAB — PAP IG (IMAGE GUIDED)

## 2014-05-23 ENCOUNTER — Telehealth: Payer: Self-pay

## 2014-05-23 NOTE — Telephone Encounter (Signed)
Patient would like to talk with Dr. Joseph Art regarding her appointment she had today with her rheumatologist

## 2014-05-23 NOTE — Telephone Encounter (Signed)
I have developed laryngitis and won't be able to talk until my voice comes back

## 2014-05-24 NOTE — Telephone Encounter (Signed)
Rheumatologist would like a copy of her labs, pt is going to come by on Monday to sign a release of information to send these results to her current rheumatologist. she wants to change her rheumatologist as you've previously discussed with her.

## 2014-06-03 ENCOUNTER — Encounter: Payer: Self-pay | Admitting: *Deleted

## 2014-06-20 ENCOUNTER — Telehealth: Payer: Self-pay | Admitting: Family Medicine

## 2014-06-20 DIAGNOSIS — M1990 Unspecified osteoarthritis, unspecified site: Secondary | ICD-10-CM

## 2014-06-20 NOTE — Telephone Encounter (Signed)
Dr. Carlean Jews - patient states she left a message last year for you regarding changing Rheumatologists and hasn't heard back from you yet.  She would like to talk to you personally [phone].

## 2014-06-21 NOTE — Telephone Encounter (Signed)
This is the second referral I have tried to make

## 2014-06-24 ENCOUNTER — Telehealth: Payer: Self-pay

## 2014-06-24 ENCOUNTER — Other Ambulatory Visit: Payer: Self-pay | Admitting: Family Medicine

## 2014-06-24 DIAGNOSIS — B081 Molluscum contagiosum: Secondary | ICD-10-CM

## 2014-06-24 MED ORDER — TRETINOIN 0.025 % EX CREA: TOPICAL_CREAM | Freq: Every day | CUTANEOUS | Status: DC

## 2014-06-24 NOTE — Telephone Encounter (Signed)
Pt called, reported that the lesions caused by her molluscum contagiosum have almost completely cleared up taking the generic tagamet  Dr L Rxd for her, but she just has three very small lesions left. She would like a topical medication if possible to put on these so that they completely resolve and do not spread. Dr L, please advise.

## 2014-09-03 ENCOUNTER — Encounter (HOSPITAL_COMMUNITY): Payer: Self-pay | Admitting: Emergency Medicine

## 2014-09-03 ENCOUNTER — Emergency Department (HOSPITAL_COMMUNITY)
Admission: EM | Admit: 2014-09-03 | Discharge: 2014-09-03 | Disposition: A | Discharge status: Home / Self Care | Payer: BLUE CROSS/BLUE SHIELD | Attending: Emergency Medicine | Admitting: Emergency Medicine

## 2014-09-03 ENCOUNTER — Ambulatory Visit (INDEPENDENT_AMBULATORY_CARE_PROVIDER_SITE_OTHER): Payer: BLUE CROSS/BLUE SHIELD | Admitting: Emergency Medicine

## 2014-09-03 DIAGNOSIS — K920 Hematemesis: Secondary | ICD-10-CM | POA: Diagnosis not present

## 2014-09-03 DIAGNOSIS — Z79899 Other long term (current) drug therapy: Secondary | ICD-10-CM | POA: Diagnosis not present

## 2014-09-03 DIAGNOSIS — L509 Urticaria, unspecified: Secondary | ICD-10-CM | POA: Diagnosis not present

## 2014-09-03 DIAGNOSIS — M069 Rheumatoid arthritis, unspecified: Secondary | ICD-10-CM | POA: Diagnosis not present

## 2014-09-03 DIAGNOSIS — J45909 Unspecified asthma, uncomplicated: Secondary | ICD-10-CM | POA: Diagnosis not present

## 2014-09-03 DIAGNOSIS — R21 Rash and other nonspecific skin eruption: Secondary | ICD-10-CM | POA: Insufficient documentation

## 2014-09-03 DIAGNOSIS — Z880 Allergy status to penicillin: Secondary | ICD-10-CM | POA: Diagnosis not present

## 2014-09-03 MED ORDER — ONDANSETRON HCL 4 MG PO TABS
4.0000 mg | ORAL_TABLET | Freq: Once | ORAL | Status: AC
  Administered 2014-09-03: 4 mg via ORAL
  Filled 2014-09-03: qty 1

## 2014-09-03 MED ORDER — PREDNISONE 10 MG PO TABS
15.0000 mg | ORAL_TABLET | Freq: Once | ORAL | Status: DC
  Filled 2014-09-03: qty 1

## 2014-09-03 NOTE — ED Notes (Addendum)
Pt complains of hives and itching with no relief.  Hives are all over.  No changes in soap, deodorant, or medication.  No relief from Bendaryl.  Pt was given by mouth 10 mg of Zyrtec and 300 mg Zantac and Urgent Care.  EMS administered 50 mg of Benadryl by mouth.  Pt reported no relief from medication.

## 2014-09-03 NOTE — ED Notes (Signed)
Paged Sharyn Lull with [facility] to [place], [place]

## 2014-09-03 NOTE — Progress Notes (Signed)
   Subjective:    Patient ID: Elizabeth Jensen, female    DOB: September 06, 1985, 29 y.o.   MRN: [id number]  HPI on the way into work in the parking lot the patient stop me and said she had vomited up blood this morning and had been having hives for the last 24 hours. The office was closed. She had arrived by bus. She was taken into the back for evaluation. Of note she does have a history of dermatomyositis. She is immunocompromised on Imuran. She had recently completed a course of Deltasone. She is a current patient of Dr. Trudie Reed. She stated she vomited about a teaspoon of blood today    Review of Systems     Objective:   Physical Exam patient was alert and cooperative. Neck supple her chest was clear heart regular rate no murmurs extremity exam revealed alopecia she had diffuse hives over her extremities and trunk.        Assessment & Plan:  EMS was called because our office was not open yet. She was given 10 mg of Zyrtec by mouth. She was given 300 mg of Zantac by mouth. She was stable at the time of transfer EMS.

## 2014-09-03 NOTE — Discharge Instructions (Signed)
Return to the emergency room with worsening of symptoms, new symptoms or with symptoms that are concerning, especially fevers, difficulty breathing, shortness of breath, chest or throat tightness, worsening rash. Please call your doctor, Dr. Willeen Cass for a followup appointment within 24-48 hours. When you talk to your doctor please let them know that you were seen in the emergency department and have them acquire all of your records so that they can discuss the findings with you and formulate a treatment plan to fully care for your new and ongoing problems.  Start taking prednisone as below. 30mg  for first 3 days, 20mg  for 3 days, 10mg  for 3 days and then 5mg  for 3 days. Continue to take 25-50mg  benadryl every 4-6 hours as needed for itchiness. Stop imuran. Read below information and follow recommendations. Drug Rash Skin reactions can be caused by several different drugs. Allergy to the medicine can cause itching, hives, and other rashes. Sun exposure causes a red rash with some medicines. Mononucleosis virus can cause a similar red rash when you are taking antibiotics. Sometimes, the rash may be accompanied by pain. The drug rash may happen with new drugs or with medicines that you have been taking for a while. The rash cannot be spread from person to person. In most cases, the symptoms of a drug rash are gone within a few days of stopping the medicine. Your rash, including hives (urticaria), is most likely from the following medicines:  Antibiotics or antimicrobials.  Anticonvulsants or seizure medicines.  Antihypertensives or blood pressure medicines.  Antimalarials.  Antidepressants or depression medicines.  Antianxiety drugs.  Diuretics or water pills.  Nonsteroidal anti-inflammatory drugs.  Simvastatin.  Lithium.  Omeprazole.  Allopurinol.  Pseudoephedrine.  Amiodarone.  Packed red blood cells, when you get a blood transfusion.  Contrast media, such as  when getting an imaging test (CT or CAT scan). This drug list is not all inclusive, but drug rashes have been reported with all the medicines listed above. Your caregiver will tell you which medicines to avoid. If you react to a medicine, a similar or worse reaction can occur the next time you take it. If you need to stop taking an antibiotic because of a drug rash, an alternative antibiotic may be needed to get rid of your infection. Antihistamine or cortisone drugs may be prescribed to help relieve your symptoms. Stay out of the sun until the rash is completely gone.  Be sure to let your caregiver know about your drug reaction. Do not take this medicine in the future. Call your caregiver if your drug rash does not improve within 3 to 4 days. SEEK IMMEDIATE MEDICAL CARE IF:   You develop breathing problems, swelling in the throat, or wheezing.  You have weakness, fainting, fever, and muscle or joint pains.  You develop blisters or peeling of skin, especially around the mouth. Document Released: 07/01/2004 Document Revised: 10/08/2013 Document Reviewed: 04/11/2008 [facility] Patient Information 2015 [place], [place]. This information is not intended to replace advice given to you by your health care provider. Make sure you discuss any questions you have with your health care provider.

## 2014-09-03 NOTE — ED Provider Notes (Signed)
CSN: [id number]     Arrival date & time 09/03/14  0223 History   First MD Initiated Contact with Patient 09/03/14 0831     Chief Complaint  Patient presents with  . Allergic Reaction     (Consider location/radiation/quality/duration/timing/severity/associated sxs/prior Treatment) HPI  Elizabeth Jensen is a 29 y.o. female with PMH of dermatomyositis, RA presenting with rash that developed last night is macular, pruritic and diffusely on arms and lower extremity concentrated proximally. No pain or discharge. No fevers chills. Pt endorses some nausea but no vomiting or abdominal pain. Pt see by urgent care and referred here. No SOB, Chest pain, difficulty breathing, throat tightness or difficulty swallowing. No new soaps, deodorants, medication or insect bites. No relief with zyrtec, zantac or benadryl 50 mg. Pt does not take methotrexate or embrel. No prednisone currently. Pt with recent increase of imuran.   Past Medical History  Diagnosis Date  . Myositis   . Asthma     NO PROBLEMS NOW  . Inflammatory arthritis   . Rheumatoid aortitis    Past Surgical History  Procedure Laterality Date  . Eye surgery    . Muscle biopsy  09/02/2011    Procedure: MUSCLE BIOPSY;  Surgeon: Earnstine Regal, MD;  Location: [place];  Service: General;  Laterality: Left;  Quadriceps muscle biospy   Family History  Problem Relation Age of Onset  . Diabetes Other    History  Substance Use Topics  . Smoking status: Never Smoker   . Smokeless tobacco: Never Used  . Alcohol Use: No   OB History    No data available     Review of Systems 10 Systems reviewed and are negative for acute change except as noted in the HPI.    Allergies  Codeine; Penicillins; and Tramadol  Home Medications   Prior to Admission medications   Medication Sig Start Date End Date Taking? Authorizing Provider  cimetidine (TAGAMET) 400 MG tablet Take 1 tablet (400 mg total) by mouth 3 (three) times daily.  04/27/14   Robyn Haber, MD  corticotropin (ACTHAR HP) 80 UNIT/ML injectable gel Inject 80 Units into the muscle 2 (two) times a week.    Historical Provider, MD  folic acid (FOLVITE) 1 MG tablet Take 2 mg by mouth daily.    Historical Provider, MD  gabapentin (NEURONTIN) 300 MG capsule Take 1 capsule (300 mg total) by mouth 3 (three) times daily. 03/17/12   Robyn Haber, MD  HYDROcodone-acetaminophen (NORCO) 10-325 MG per tablet Take 1 tablet by mouth every 8 (eight) hours as needed. 04/27/14   Robyn Haber, MD  medroxyPROGESTERone (DEPO-PROVERA) 150 MG/ML injection Inject 150 mg into the muscle every 3 (three) months.    Historical Provider, MD  methotrexate (RHEUMATREX) 2.5 MG tablet Take 8 tablets (20 mg total) by mouth once a week. Takes on Friday! 03/17/12   Robyn Haber, MD  Multiple Vitamin (MULTIVITAMIN) tablet Take 1 tablet by mouth daily.    Historical Provider, MD  predniSONE (DELTASONE) 5 MG tablet Take 8 mg by mouth daily.     Historical Provider, MD  pregabalin (LYRICA) 50 MG capsule Take 50 mg by mouth 2 (two) times daily.    Historical Provider, MD  PRESCRIPTION MEDICATION Otrexup 20 mg auto injectable taken once weekly on Friday.    Historical Provider, MD  tretinoin (RETIN-A) 0.025 % cream Apply topically at bedtime. 06/24/14   Robyn Haber, MD  VITAMIN D, CHOLECALCIFEROL, PO Take by mouth daily.    Historical  Provider, MD  zolpidem (AMBIEN) 10 MG tablet Take 10 mg by mouth at bedtime as needed for sleep.    Historical Provider, MD   BP 100/63 mmHg  Pulse 73  Temp(Src) 97.9 F (36.6 C) (Oral)  Resp 16  Ht 5\' 4"  (1.626 m)  Wt 112 lb (50.803 kg)  BMI 19.22 kg/m2  SpO2 100%  LMP 08/17/2014 Physical Exam  Constitutional: She appears well-developed and well-nourished. No distress.  HENT:  Head: Normocephalic and atraumatic.  No trismus, drooling, oropharynx swelling or lesions.  Eyes: Conjunctivae and EOM are normal. Right eye exhibits no discharge. Left eye  exhibits no discharge.  Cardiovascular: Normal rate and regular rhythm.   Pulmonary/Chest: Effort normal and breath sounds normal. No respiratory distress. She has no wheezes.  Abdominal: Soft. Bowel sounds are normal. She exhibits no distension. There is no tenderness.  Neurological: She is alert. She exhibits normal muscle tone. Coordination normal.  Skin: Skin is warm and dry. She is not diaphoretic.  Diffuse macular rash without papules or wheals, concentrated on proximal posterior lower extremity as well as hands and upper extremity. No vesicular lesion. Pt with excoriations  Nursing note and vitals reviewed.   ED Course  Procedures (including critical care time) Labs Review Labs Reviewed - No data to display  Imaging Review No results found.   EKG Interpretation None      MDM   Final diagnoses:  Rash   Pt with rash with nausea. zofran administered and patient tolerating fluids in ED. Nausea resolved. no abdominal tenderness or vomiting. Lungs CTAB. No oropharynx swelling or lesions.   10:29 AM Spoke with Dr. Arlean Hopping PA-C, Julien Nordmann who recommended prednisone taper, symptomatic relief and close follow up in the office.  10:10 Am Pt ademate that she does not see Dr. Estanislado Pandy. Pt followed by Dr. Trudie Reed. Spoke with Leafy Kindle who is familiar with the patient. She felt the rash may have been an increase in the imuran dose. She recommended prednisone taper. Pt with prednisone script that is not expired. Ms Annamaria Boots recommended different regimen which is in patient's discharge summary. Pt to stop taking imuran and call for follow up appointment.  Discussed return precautions with patient. Discussed all results and patient verbalizes understanding and agrees with plan.  Case has been discussed with Dr. Roderic Palau who agrees with the above plan and to discharge.    Al Corpus, PA-C 09/03/14 [place], MD 09/03/14 [phone]

## 2014-09-11 DIAGNOSIS — M2550 Pain in unspecified joint: Secondary | ICD-10-CM | POA: Insufficient documentation

## 2014-09-11 DIAGNOSIS — M797 Fibromyalgia: Secondary | ICD-10-CM | POA: Insufficient documentation

## 2014-09-11 DIAGNOSIS — R5383 Other fatigue: Secondary | ICD-10-CM | POA: Insufficient documentation

## 2014-09-25 ENCOUNTER — Ambulatory Visit (INDEPENDENT_AMBULATORY_CARE_PROVIDER_SITE_OTHER): Payer: BLUE CROSS/BLUE SHIELD | Admitting: Physician Assistant

## 2014-09-25 VITALS — BP 110/68 | HR 83 | Temp 98.0°F | Resp 16 | Ht 65.0 in | Wt 111.0 lb

## 2014-09-25 DIAGNOSIS — R0981 Nasal congestion: Secondary | ICD-10-CM | POA: Diagnosis not present

## 2014-09-25 DIAGNOSIS — M3390 Dermatopolymyositis, unspecified, organ involvement unspecified: Secondary | ICD-10-CM | POA: Diagnosis not present

## 2014-09-25 MED ORDER — IPRATROPIUM BROMIDE 0.03 % NA SOLN: 2.0000 | Freq: Two times a day (BID) | NASAL | Status: DC

## 2014-09-25 NOTE — Patient Instructions (Signed)
Dermatomyositis Dermatomyositis is an acquired muscle disease. Dermatomyositis causes muscle weakness. It usually affects muscles that are closest to the trunk of your body. It can lead to difficulty rising from a sitting position, climbing stairs, lifting objects, or reaching overhead. The muscle weakness is usually accompanied by pain and swelling. CAUSES The cause of dermatomyositis is unknown. RISK FACTORS Females are more often affected than males. SIGNS AND SYMPTOMS  Symptoms of dermatomyositis may include:  A rash. This is often seen before the muscle weakness occurs. It is bluish-purple. It appears on your face, neck, shoulders, upper chest, elbows, knees, knuckles, and back. The rash may be itchy.  Swelling of your face and eyelids.  Hardened bumps of calcium deposits under your skin (especially in children).  Trouble swallowing, talking, and breathing.  Occasionally, the muscles ache and are tender to touch.  Constant tiredness (fatigue).  Weight loss.  Low-grade fever. DIAGNOSIS  Various tests may be done, including:  Blood tests. This can help determine if you have elevated levels of muscle enzymes such as creatine kinase and aldolase. Rises in these levels can indicate muscle damage. Blood tests can also detect autoantibodies associated with different symptoms of dermatomyositis.  Electromyogram (EMG) or electromyography. This test shows whether your muscles are responding to electrical signals from your nerves in the proper manner.  Muscle biopsy. For this test, a surgical procedure is done to remove a small sample of tissue from a weakened muscle. This tissue sample is then looked at under a microscope.  Skin biopsy. For this test, a small sample of skin is taken from an area with a rash. The sample is then examined under a microscope.  Chest X-ray. This imaging test can check for signs of the type of lung damage that sometimes occurs with dermatomyositis.  MRI  imaging. This test can be used to evaluate your muscles for signs of abnormal inflammation. TREATMENT  Treatment of dermatomyositis includes:  The use of prednisone and other drugs that suppress your immune system.  The use of immunoglobulin.  Physical therapy to prevent the thinning of your muscles (atrophy). HOME CARE INSTRUCTIONS  Wear protective clothing and high-protection sunscreen when you go outside. Areas affected by the rash are more sensitive to the sun.  Only take over-the-counter or prescription medicines as directed by your health care provider. SEEK MEDICAL CARE IF:  Your condition worsens.  You have unexplained muscle weakness.  You develop a new rash.  You have a fever and cough. SEEK IMMEDIATE MEDICAL CARE IF:  You have difficulty swallowing.  You have breathing problems. Document Released: 02/13/2002 Document Revised: 05/29/2013 Document Reviewed: 01/29/2013 [facility] Patient Information 2015 [place], [place]. This information is not intended to replace advice given to you by your health care provider. Make sure you discuss any questions you have with your health care provider.

## 2014-09-25 NOTE — Progress Notes (Signed)
Subjective:    Patient ID: Elizabeth Jensen, female    DOB: [birth year], 29 y.o.   MRN: [id number]  HPI Patient with PMH of dermatomyositis (Dx 2013) acutely having her second flare presents with worsening of sx of weakness, fatigue, and myalgias over past 2 days. Having flare for past 2 weeks. Additionally endorses chills, HA/dizziness, temp of 99.9, and nasal congestion. Took diclofenac last night and temp resolved. Saw rheumatologist 2 weeks ago and new treatment of Methotrexate 1.16 mg injections was started. Denies SOB/CP, change in vision, N/V, urinary sx, or diarrhea. Has f/u appt with rheumatologist 10/2014.  Med allergy to Codeine, Tramadol, and PCN.  Review of Systems  Constitutional: Positive for fever (resolved), diaphoresis (resolved), activity change (decreased), appetite change (decrased) and fatigue. Negative for chills and unexpected weight change.  HENT: Positive for congestion. Negative for ear pain, postnasal drip, rhinorrhea, sinus pressure, sneezing and sore throat.   Eyes: Negative for photophobia, pain, itching and visual disturbance.  Respiratory: Negative for shortness of breath and wheezing.   Cardiovascular: Negative for chest pain and palpitations.  Gastrointestinal: Negative for nausea, vomiting and diarrhea.  Genitourinary: Negative.   Musculoskeletal: Positive for myalgias.  Skin: Negative for rash.  Allergic/Immunologic: Positive for immunocompromised state.  Neurological: Positive for dizziness, weakness and headaches. Negative for numbness.       Objective:   Physical Exam  Constitutional: She is oriented to person, place, and time. She appears well-developed and well-nourished. No distress.  Blood pressure 110/68, pulse 83, temperature 98 F (36.7 C), temperature source Oral, resp. rate 16, height 5\' 5"  (1.651 m), weight 111 lb (50.349 kg), last menstrual period 08/17/2014, SpO2 100 %.  HENT:  Head: Normocephalic and atraumatic.  Right Ear: External ear  normal.  Left Ear: External ear normal.  Mouth/Throat: Oropharynx is clear and moist. No oropharyngeal exudate.  Tonsils 1+ bilaterally without erythema or exudate.  Eyes: Conjunctivae are normal. Pupils are equal, round, and reactive to light. Right eye exhibits no discharge. Left eye exhibits no discharge. No scleral icterus.  Neck: Normal range of motion. Neck supple. No thyromegaly present.  Cardiovascular: Normal rate, regular rhythm and normal heart sounds.  Exam reveals no gallop and no friction rub.   No murmur heard. Pulmonary/Chest: Effort normal and breath sounds normal. No respiratory distress. She has no wheezes. She has no rales.  Abdominal: Soft. Bowel sounds are normal. She exhibits no distension. There is no tenderness. There is no rebound, no guarding and no CVA tenderness.  Lymphadenopathy:    She has no cervical adenopathy.  Neurological: She is alert and oriented to person, place, and time. No cranial nerve deficit or sensory deficit. She exhibits normal muscle tone. Coordination normal.  Skin: Skin is warm and dry. No rash noted. She is not diaphoretic. No erythema. No pallor.       Assessment & Plan:  1. Dermatomyositis Under the care of Dr. Gavin Pound and Leafy Kindle, PA-C for rheumatology. Spoke with Dr. Trudie Reed by way of her nurse Caryl Pina, who recommends no additional labs being drawn as she just did full rheum panel 2 weeks ago and not to add any medication to treat d/o as patient was recently started on methotrexate and needs time her therapy to work.  Should take diclofenac prn. Has f/u appt May 2016.  2. Nasal congestion Drink plenty of water. - ipratropium (ATROVENT) 0.03 % nasal spray; Place 2 sprays into both nostrils 2 (two) times daily.  Dispense: 30 mL; Refill: 0  Alveta Heimlich PA-C  [facility] 09/25/2014 1:31 PM

## 2014-09-27 ENCOUNTER — Ambulatory Visit (INDEPENDENT_AMBULATORY_CARE_PROVIDER_SITE_OTHER): Payer: BLUE CROSS/BLUE SHIELD | Admitting: Internal Medicine

## 2014-09-27 ENCOUNTER — Other Ambulatory Visit: Payer: Self-pay | Admitting: Internal Medicine

## 2014-09-27 VITALS — BP 100/62 | HR 108 | Temp 98.5°F | Resp 18 | Ht 65.0 in | Wt 110.8 lb

## 2014-09-27 DIAGNOSIS — R1011 Right upper quadrant pain: Secondary | ICD-10-CM | POA: Diagnosis not present

## 2014-09-27 DIAGNOSIS — R7989 Other specified abnormal findings of blood chemistry: Secondary | ICD-10-CM | POA: Diagnosis not present

## 2014-09-27 DIAGNOSIS — R5383 Other fatigue: Secondary | ICD-10-CM | POA: Diagnosis not present

## 2014-09-27 DIAGNOSIS — R112 Nausea with vomiting, unspecified: Secondary | ICD-10-CM

## 2014-09-27 DIAGNOSIS — R945 Abnormal results of liver function studies: Secondary | ICD-10-CM

## 2014-09-27 DIAGNOSIS — R509 Fever, unspecified: Secondary | ICD-10-CM | POA: Diagnosis not present

## 2014-09-27 DIAGNOSIS — R350 Frequency of micturition: Secondary | ICD-10-CM | POA: Diagnosis not present

## 2014-09-27 LAB — POCT CBC
Granulocyte percent: 55 %G (ref 37–80)
HCT, POC: 39 % (ref 37.7–47.9)
HEMOGLOBIN: 12.7 g/dL (ref 12.2–16.2)
LYMPH, POC: 2.1 (ref 0.6–3.4)
MCH: 29.3 pg (ref 27–31.2)
MCHC: 32.6 g/dL (ref 31.8–35.4)
MCV: 89.8 fL (ref 80–97)
MID (cbc): 0.6 (ref 0–0.9)
MPV: 6.4 fL (ref 0–99.8)
POC GRANULOCYTE: 3.3 (ref 2–6.9)
POC LYMPH %: 34.8 % (ref 10–50)
POC MID %: 10.2 %M (ref 0–12)
Platelet Count, POC: 257 10*3/uL (ref 142–424)
RBC: 4.35 M/uL (ref 4.04–5.48)
RDW, POC: 16.8 %
WBC: 6 10*3/uL (ref 4.6–10.2)

## 2014-09-27 LAB — POCT URINALYSIS DIPSTICK
Bilirubin, UA: NEGATIVE
Glucose, UA: NEGATIVE
Ketones, UA: NEGATIVE
Nitrite, UA: NEGATIVE
PROTEIN UA: NEGATIVE
SPEC GRAV UA: 1.025
Urobilinogen, UA: 0.2
pH, UA: 6

## 2014-09-27 LAB — POCT UA - MICROSCOPIC ONLY
CASTS, UR, LPF, POC: NEGATIVE
Crystals, Ur, HPF, POC: NEGATIVE
MUCUS UA: NEGATIVE
RBC, urine, microscopic: NEGATIVE
YEAST UA: NEGATIVE

## 2014-09-27 LAB — POCT SEDIMENTATION RATE: POCT SED RATE: 25 mm/hr — AB (ref 0–22)

## 2014-09-27 NOTE — Progress Notes (Signed)
Subjective:    Patient ID: Teneshia Hedeen, female    DOB: 04/20/1986, 29 y.o.   MRN: [id number]  This chart was scribed for Leandrew Koyanagi, MD by Stephania Fragmin, ED Scribe. This patient was seen in room 10 and the patient's care was started at 10:37 AM.   HPI   Chief Complaint  Patient presents with  . Generalized Body Aches    x 1 week  . Emesis    x 1 week  . Fatigue    x 1 week   . Night Sweats    x 1 week    HPI Comments: Rosenda Geffrard is a 29 y.o. female with a history dermatomyositis who presents to the [facility] complaining of fever, generalized myalgias, night sweats that have been ongoing for 1 week. Her symptoms began with a severe headache that developed through last week. She then started having a fever, measured to be 99.9 at one point, and would wake up drenched in sweat at night. Patient also complains of associated fatigue, dizziness, vomiting, urinary frequency, and eye itching over the past week. She has also had constipation, but thinks this is due to starting methotrexate recently. She was taking imuran for some time, but was evaluated in the ED 1 month ago (see chart review), where Dr. Annamaria Boots suggested the imuran was causing a rash and she was told to stop and be placed on a prednisone taper. Patient has a history of dermatomyositis, which affects her muscles more than her skin. She denies rhinorrhea, sneezing, sore throat, cough, rash, vaginal discharge, or ear pain. She denies a history of thyroid problems.  She states she has been more tired than usual over the past month. Patient had been examined at the rheumatologist, where she was told she was having an exacerbation of her dermatomyositis.     Patient Active Problem List   Diagnosis Date Noted  . 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 Dermatomyositis 08/31/2011    Prior to Admission medications     Medication Sig Start Date End Date Taking? Authorizing Provider  diclofenac (VOLTAREN) 50 MG EC tablet Take 50 mg by mouth 2 (two) times daily.   Yes Historical Provider, MD  folic acid (FOLVITE) 1 MG tablet Take 1 mg by mouth daily.   Yes Historical Provider, MD  ipratropium (ATROVENT) 0.03 % nasal spray Place 2 sprays into both nostrils 2 (two) times daily. 09/25/14  Yes Tishira R Brewington, PA-C  methotrexate (50 MG/ML) 1 G injection Inject into the vein once.   Yes Historical Provider, MD  ondansetron (ZOFRAN) 4 MG tablet Take 4 mg by mouth every 8 (eight) hours as needed for nausea or vomiting.   Yes Historical Provider, MD  predniSONE (DELTASONE) 10 MG tablet Take 10-40 mg by mouth See admin instructions. 40mg  for 1 week, 30mg  for 1 week, 20mg  for 1 week, 10mg  for 1 week   Yes Historical Provider, MD  gabapentin (NEURONTIN) 300 MG capsule Take 1 capsule (300 mg total) by mouth 3 (three) times daily. Patient not taking: Reported on 09/03/2014 03/17/12   Robyn Haber, MD  HYDROcodone-acetaminophen [facility]) 10-325 MG per tablet Take 1 tablet by mouth every 8 (eight) hours as needed. Patient not taking: Reported on 09/03/2014 04/27/14   Robyn Haber, MD  methotrexate (RHEUMATREX) 2.5 MG tablet Take 8 tablets (20 mg total) by mouth once a week. Takes on  Friday! Patient not taking: Reported on 09/03/2014 03/17/12   Robyn Haber, MD  methotrexate (RHEUMATREX) 2.5 MG tablet Take 2.5 mg by mouth 3 (three) times a week.    Historical Provider, MD  ondansetron (ZOFRAN-ODT) 4 MG disintegrating tablet  09/11/14   Historical Provider, MD  tretinoin (RETIN-A) 0.025 % cream Apply topically at bedtime. Patient not taking: Reported on 09/03/2014 06/24/14   Robyn Haber, MD     Review of Systems  Constitutional: Positive for fatigue.  HENT: Negative for ear pain, rhinorrhea, sneezing and sore throat.   Respiratory: Negative for cough.   Gastrointestinal: Positive for vomiting and constipation.   Genitourinary: Positive for frequency. Negative for vaginal discharge.  Skin: Negative for rash.  Neurological: Positive for dizziness.       Objective:   Physical Exam  Constitutional: She is oriented to person, place, and time. She appears well-developed and well-nourished. No distress.  HENT:  Right Ear: External ear normal.  Left Ear: External ear normal.  Nose: Nose normal.  Mouth/Throat: Oropharynx is clear and moist. No oropharyngeal exudate.  Ears, nose, and throat are normal.  Eyes: Conjunctivae and EOM are normal. Pupils are equal, round, and reactive to light.  Eyes are normal.  Neck: Neck supple. No thyromegaly present.  Cardiovascular: Normal rate, regular rhythm and normal heart sounds.   No murmur heard. Pulmonary/Chest: Effort normal and breath sounds normal.  Lungs are clear to auscultation.   Abdominal: She exhibits no distension and no mass. There is tenderness. There is guarding. There is no rebound.  Tender to the RUQ with a palpable liver edge that is tender. Also tender to periumbilical area.  Lymphadenopathy:    She has no cervical adenopathy.  Neurological: She is alert and oriented to person, place, and time. No cranial nerve deficit.  Psychiatric: She has a normal mood and affect.  Nursing note and vitals reviewed.      Assessment & Plan:   I have completed the patient encounter in its entirety as documented by the scribe, with editing by me where necessary. Jerelle Virden P. Laney Pastor, M.D. Fever, unspecified fever cause - Plan: POCT CBC, POCT SEDIMENTATION RATE  Other fatigue - Plan: TSH  Urine frequency - Plan: POCT UA - Microscopic Only, POCT urinalysis dipstick  Abdominal pain, RUQ - Plan: Comprehensive metabolic panel  Non-intractable vomiting with nausea, vomiting of unspecified type - Plan: Comprehensive metabolic panel  No change in current medications Notify lab results and follow-up   Addendum 09/28/2014 labs Results for orders placed  or performed in visit on 09/27/14  Comprehensive metabolic panel  Result Value Ref Range   Sodium 137 135 - 145 mEq/L   Potassium 4.0 3.5 - 5.3 mEq/L   Chloride 100 96 - 112 mEq/L   CO2 21 19 - 32 mEq/L   Glucose, Bld 103 (H) 70 - 99 mg/dL   BUN 10 6 - 23 mg/dL   Creat 0.87 0.50 - 1.10 mg/dL   Total Bilirubin 0.9 0.2 - 1.2 mg/dL   Alkaline Phosphatase 58 39 - 117 U/L   AST 58 (H) 0 - 37 U/L   ALT 83 (H) 0 - 35 U/L   Total Protein 7.4 6.0 - 8.3 g/dL   Albumin 4.2 3.5 - 5.2 g/dL   Calcium 9.2 8.4 - 10.5 mg/dL  TSH  Result Value Ref Range   TSH 0.778 0.350 - 4.500 uIU/mL  POCT CBC  Result Value Ref Range   WBC 6.0 4.6 - 10.2 K/uL   Lymph,  poc 2.1 0.6 - 3.4   POC LYMPH PERCENT 34.8 10 - 50 %L   MID (cbc) 0.6 0 - 0.9   POC MID % 10.2 0 - 12 %M   POC Granulocyte 3.3 2 - 6.9   Granulocyte percent 55.0 37 - 80 %G   RBC 4.35 4.04 - 5.48 M/uL   Hemoglobin 12.7 12.2 - 16.2 g/dL   HCT, POC 39.0 37.7 - 47.9 %   MCV 89.8 80 - 97 fL   MCH, POC 29.3 27 - 31.2 pg   MCHC 32.6 31.8 - 35.4 g/dL   RDW, POC 16.8 %   Platelet Count, POC 257 142 - 424 K/uL   MPV 6.4 0 - 99.8 fL  POCT SEDIMENTATION RATE  Result Value Ref Range   POCT SED RATE 25 (A) 0 - 22 mm/hr  POCT UA - Microscopic Only  Result Value Ref Range   WBC, Ur, HPF, POC 0-1    RBC, urine, microscopic neg    Bacteria, U Microscopic trace    Mucus, UA neg    Epithelial cells, urine per micros 3-8    Crystals, Ur, HPF, POC neg    Casts, Ur, LPF, POC neg    Yeast, UA neg   POCT urinalysis dipstick  Result Value Ref Range   Color, UA amber    Clarity, UA cloudy    Glucose, UA neg    Bilirubin, UA neg    Ketones, UA neg    Spec Grav, UA 1.025    Blood, UA small    pH, UA 6.0    Protein, UA neg    Urobilinogen, UA 0.2    Nitrite, UA neg    Leukocytes, UA Trace    Hepatitis C was negative in 2013 with hepatitis B antibody positive Abdominal ultrasound -2013 Chlamydia +2013///HIV nonreactive   ? Abnormal LFTs  secondary to methotrexate She is symptomatic and this might explain some of her symptoms We'll screen for hepatitis C again and set up ultrasound before asking rheumatology to consider lowering the dose

## 2014-09-28 LAB — COMPREHENSIVE METABOLIC PANEL
ALBUMIN: 4.2 g/dL (ref 3.5–5.2)
ALK PHOS: 58 U/L (ref 39–117)
ALT: 83 U/L — ABNORMAL HIGH (ref 0–35)
AST: 58 U/L — AB (ref 0–37)
BUN: 10 mg/dL (ref 6–23)
CO2: 21 mEq/L (ref 19–32)
Calcium: 9.2 mg/dL (ref 8.4–10.5)
Chloride: 100 mEq/L (ref 96–112)
Creat: 0.87 mg/dL (ref 0.50–1.10)
Glucose, Bld: 103 mg/dL — ABNORMAL HIGH (ref 70–99)
POTASSIUM: 4 meq/L (ref 3.5–5.3)
SODIUM: 137 meq/L (ref 135–145)
TOTAL PROTEIN: 7.4 g/dL (ref 6.0–8.3)
Total Bilirubin: 0.9 mg/dL (ref 0.2–1.2)

## 2014-09-28 LAB — TSH: TSH: 0.778 u[IU]/mL (ref 0.350–4.500)

## 2014-09-30 ENCOUNTER — Telehealth: Payer: Self-pay

## 2014-09-30 LAB — HEPATITIS C ANTIBODY: HCV Ab: NEGATIVE

## 2014-09-30 NOTE — Telephone Encounter (Signed)
Hep c added.

## 2014-09-30 NOTE — Telephone Encounter (Signed)
-----   Message from Leandrew Koyanagi, MD sent at 09/28/2014  2:41 PM EDT ----- Add a hepatitis C antibody to the lab work due to abnormal LFTs

## 2014-10-03 ENCOUNTER — Encounter (HOSPITAL_COMMUNITY): Payer: Self-pay | Admitting: Emergency Medicine

## 2014-10-03 ENCOUNTER — Emergency Department (HOSPITAL_COMMUNITY): Payer: BLUE CROSS/BLUE SHIELD

## 2014-10-03 ENCOUNTER — Emergency Department (HOSPITAL_COMMUNITY)
Admission: EM | Admit: 2014-10-03 | Discharge: 2014-10-03 | Disposition: A | Discharge status: Home / Self Care | Payer: BLUE CROSS/BLUE SHIELD | Attending: Emergency Medicine | Admitting: Emergency Medicine

## 2014-10-03 DIAGNOSIS — M064 Inflammatory polyarthropathy: Secondary | ICD-10-CM | POA: Diagnosis not present

## 2014-10-03 DIAGNOSIS — Z791 Long term (current) use of non-steroidal anti-inflammatories (NSAID): Secondary | ICD-10-CM | POA: Insufficient documentation

## 2014-10-03 DIAGNOSIS — Z3202 Encounter for pregnancy test, result negative: Secondary | ICD-10-CM | POA: Diagnosis not present

## 2014-10-03 DIAGNOSIS — J45909 Unspecified asthma, uncomplicated: Secondary | ICD-10-CM | POA: Diagnosis not present

## 2014-10-03 DIAGNOSIS — R5381 Other malaise: Secondary | ICD-10-CM | POA: Insufficient documentation

## 2014-10-03 DIAGNOSIS — Z880 Allergy status to penicillin: Secondary | ICD-10-CM | POA: Diagnosis not present

## 2014-10-03 DIAGNOSIS — Z79899 Other long term (current) drug therapy: Secondary | ICD-10-CM | POA: Diagnosis not present

## 2014-10-03 DIAGNOSIS — R52 Pain, unspecified: Secondary | ICD-10-CM | POA: Diagnosis present

## 2014-10-03 LAB — PREGNANCY, URINE: PREG TEST UR: NEGATIVE

## 2014-10-03 LAB — CBC WITH DIFFERENTIAL/PLATELET
Basophils Absolute: 0.1 10*3/uL (ref 0.0–0.1)
Basophils Relative: 3 % — ABNORMAL HIGH (ref 0–1)
EOS PCT: 1 % (ref 0–5)
Eosinophils Absolute: 0 10*3/uL (ref 0.0–0.7)
HCT: 34.7 % — ABNORMAL LOW (ref 36.0–46.0)
Hemoglobin: 11.7 g/dL — ABNORMAL LOW (ref 12.0–15.0)
LYMPHS ABS: 1.6 10*3/uL (ref 0.7–4.0)
Lymphocytes Relative: 38 % (ref 12–46)
MCH: 30.3 pg (ref 26.0–34.0)
MCHC: 33.7 g/dL (ref 30.0–36.0)
MCV: 89.9 fL (ref 78.0–100.0)
Monocytes Absolute: 0.7 10*3/uL (ref 0.1–1.0)
Monocytes Relative: 16 % — ABNORMAL HIGH (ref 3–12)
NEUTROS PCT: 42 % — AB (ref 43–77)
Neutro Abs: 1.9 10*3/uL (ref 1.7–7.7)
PLATELETS: 272 10*3/uL (ref 150–400)
RBC: 3.86 MIL/uL — ABNORMAL LOW (ref 3.87–5.11)
RDW: 16.9 % — AB (ref 11.5–15.5)
WBC: 4.3 10*3/uL (ref 4.0–10.5)

## 2014-10-03 LAB — URINALYSIS, ROUTINE W REFLEX MICROSCOPIC
Bilirubin Urine: NEGATIVE
GLUCOSE, UA: NEGATIVE mg/dL
HGB URINE DIPSTICK: NEGATIVE
Ketones, ur: NEGATIVE mg/dL
LEUKOCYTES UA: NEGATIVE
Nitrite: NEGATIVE
PH: 5.5 (ref 5.0–8.0)
Protein, ur: NEGATIVE mg/dL
Specific Gravity, Urine: 1.026 (ref 1.005–1.030)
Urobilinogen, UA: 1 mg/dL (ref 0.0–1.0)

## 2014-10-03 LAB — COMPREHENSIVE METABOLIC PANEL
ALBUMIN: 3.7 g/dL (ref 3.5–5.2)
ALK PHOS: 75 U/L (ref 39–117)
ALT: 121 U/L — ABNORMAL HIGH (ref 0–35)
AST: 88 U/L — ABNORMAL HIGH (ref 0–37)
Anion gap: 8 (ref 5–15)
BUN: 6 mg/dL (ref 6–23)
CALCIUM: 9.1 mg/dL (ref 8.4–10.5)
CO2: 24 mmol/L (ref 19–32)
Chloride: 105 mmol/L (ref 96–112)
Creatinine, Ser: 0.97 mg/dL (ref 0.50–1.10)
GFR calc Af Amer: >90 mL/min (ref >90)
GFR calc non Af Amer: 80 mL/min — ABNORMAL LOW (ref >90)
Glucose, Bld: 87 mg/dL (ref 70–99)
Potassium: 3.8 mmol/L (ref 3.5–5.1)
SODIUM: 137 mmol/L (ref 135–145)
TOTAL PROTEIN: 7.6 g/dL (ref 6.0–8.3)
Total Bilirubin: 1 mg/dL (ref 0.3–1.2)

## 2014-10-03 LAB — MONONUCLEOSIS SCREEN: Mono Screen: NEGATIVE

## 2014-10-03 MED ORDER — SODIUM CHLORIDE 0.9 % IV BOLUS (SEPSIS)
1000.0000 mL | Freq: Once | INTRAVENOUS | Status: AC
  Administered 2014-10-03: 1000 mL via INTRAVENOUS

## 2014-10-03 NOTE — ED Notes (Signed)
MD at bedside. 

## 2014-10-03 NOTE — ED Notes (Signed)
Patient transported to X-ray 

## 2014-10-03 NOTE — ED Provider Notes (Signed)
CSN: [id number]     Arrival date & time 10/03/14  7209 History   First MD Initiated Contact with Patient 10/03/14 [phone]     Chief Complaint  Patient presents with  . Generalized Body Aches     The history is provided by the patient. No language interpreter was used.   Elizabeth Jensen presents for evaluation of body aches. She reports 2 weeks of generalized body aches and malaise. She has chills and nausea. She developed a temperature of 100 every evening around 5:30 for the last 2 weeks. She is having soaking night sweats for the last 2 weeks. She has a history of dermatomyositis and takes weekly methotrexate. Symptoms are moderate, constant, worsening. She denies any cough, shortness of breath, vomiting, abdominal pain, dysuria, vaginal discharge.  Past Medical History  Diagnosis Date  . Myositis   . Asthma     NO PROBLEMS NOW  . Inflammatory arthritis   . Rheumatoid aortitis    Past Surgical History  Procedure Laterality Date  . Eye surgery    . Muscle biopsy  09/02/2011    Procedure: MUSCLE BIOPSY;  Surgeon: Earnstine Regal, MD;  Location: [facility];  Service: General;  Laterality: Left;  Quadriceps muscle biospy   Family History  Problem Relation Age of Onset  . Diabetes Other    History  Substance Use Topics  . Smoking status: Never Smoker   . Smokeless tobacco: Never Used  . Alcohol Use: No   OB History    No data available     Review of Systems  All other systems reviewed and are negative.     Allergies  Codeine; Penicillins; and Tramadol  Home Medications   Prior to Admission medications   Medication Sig Start Date End Date Taking? Authorizing Provider  diclofenac (VOLTAREN) 50 MG EC tablet Take 50 mg by mouth 2 (two) times daily.    Historical Provider, MD  folic acid (FOLVITE) 1 MG tablet Take 1 mg by mouth daily.    Historical Provider, MD  gabapentin (NEURONTIN) 300 MG capsule Take 1 capsule (300 mg total) by mouth 3 (three) times  daily. Patient not taking: Reported on 09/03/2014 03/17/12   Robyn Haber, MD  HYDROcodone-acetaminophen [facility]) 10-325 MG per tablet Take 1 tablet by mouth every 8 (eight) hours as needed. Patient not taking: Reported on 09/03/2014 04/27/14   Robyn Haber, MD  ipratropium (ATROVENT) 0.03 % nasal spray Place 2 sprays into both nostrils 2 (two) times daily. 09/25/14   Tishira R Brewington, PA-C  methotrexate (50 MG/ML) 1 G injection Inject into the vein once.    Historical Provider, MD  methotrexate (RHEUMATREX) 2.5 MG tablet Take 8 tablets (20 mg total) by mouth once a week. Takes on Friday! Patient not taking: Reported on 09/03/2014 03/17/12   Robyn Haber, MD  methotrexate (RHEUMATREX) 2.5 MG tablet Take 2.5 mg by mouth 3 (three) times a week.    Historical Provider, MD  ondansetron (ZOFRAN) 4 MG tablet Take 4 mg by mouth every 8 (eight) hours as needed for nausea or vomiting.    Historical Provider, MD  ondansetron (ZOFRAN-ODT) 4 MG disintegrating tablet  09/11/14   Historical Provider, MD  predniSONE (DELTASONE) 10 MG tablet Take 10-40 mg by mouth See admin instructions. 40mg  for 1 week, 30mg  for 1 week, 20mg  for 1 week, 10mg  for 1 week    Historical Provider, MD  tretinoin (RETIN-A) 0.025 % cream Apply topically at bedtime. Patient not taking: Reported on 09/03/2014  06/24/14   Robyn Haber, MD   BP 126/69 mmHg  Pulse 98  Temp(Src) 98.4 F (36.9 C) (Oral)  Resp 18  SpO2 100%  LMP 09/09/2014 Physical Exam  Constitutional: She is oriented to person, place, and time. She appears well-developed and well-nourished.  HENT:  Head: Normocephalic and atraumatic.  Cardiovascular: Normal rate and regular rhythm.   No murmur heard. Pulmonary/Chest: Effort normal and breath sounds normal. No respiratory distress.  Abdominal: Soft. There is no tenderness. There is no rebound and no guarding.  Musculoskeletal: She exhibits no edema or tenderness.  Neurological: She is alert and oriented to  person, place, and time.  Skin: Skin is warm and dry.  Psychiatric: She has a normal mood and affect. Her behavior is normal.  Nursing note and vitals reviewed.   ED Course  Procedures (including critical care time) Labs Review Labs Reviewed  COMPREHENSIVE METABOLIC PANEL - Abnormal; Notable for the following:    AST 88 (*)    ALT 121 (*)    GFR calc non Af Amer 80 (*)    All other components within normal limits  CBC WITH DIFFERENTIAL/PLATELET - Abnormal; Notable for the following:    RBC 3.86 (*)    Hemoglobin 11.7 (*)    HCT 34.7 (*)    RDW 16.9 (*)    Neutrophils Relative % 42 (*)    Monocytes Relative 16 (*)    Basophils Relative 3 (*)    All other components within normal limits  URINALYSIS, ROUTINE W REFLEX MICROSCOPIC  PREGNANCY, URINE  MONONUCLEOSIS SCREEN    Imaging Review Dg Chest 2 View  10/03/2014   CLINICAL DATA:  Fever and body aches for 2 days.  EXAM: CHEST  2 VIEW  COMPARISON:  PA and lateral chest 02/21/2013.  FINDINGS: Heart size and mediastinal contours are within normal limits. Both lungs are clear. Visualized skeletal structures are unremarkable.  IMPRESSION: Negative exam.   Electronically Signed   By: Inge Rise M.D.   On: 10/03/2014 10:57     EKG Interpretation None      MDM   Final diagnoses:  Malaise    Patient here for 2 weeks of malaise and fevers at home in the evening. Patient is nontoxic and well-hydrated on examination. There is no evidence of acute serious bacterial infection. History of presentation is not consistent with endocarditis. There is minimal elevation in patient's transaminases, discussed with patient discussing this with her rheumatologist and considering changing her discontinuing her methotrexate therapy. Discussed home care for fever as well as return precautions.    Quintella Reichert, MD 10/03/14 1630

## 2014-10-03 NOTE — ED Notes (Signed)
Pt has been having fevers and body aches for 2 weeks; was checked out at [facility]. States urine was clean. No chest xray. LFTs were bumped and checked for hepatitis. No mono screen. Has autoimmune disease and takes chemo drugs.

## 2014-10-03 NOTE — ED Notes (Signed)
Pt returned from X-ray.  

## 2014-10-03 NOTE — ED Notes (Signed)
Pt informed that UA sample was needed. States she will call out when she feels like she can go.

## 2014-10-03 NOTE — Discharge Instructions (Signed)
The cause of your fever was not identified today.  Your liver enzymes are slightly elevated and this will need to be rechecked by your family doctor in the next few days.    Fever, Adult A fever is a higher than normal body temperature. In an adult, an oral temperature around 98.6 F (37 C) is considered normal. A temperature of 100.4 F (38 C) or higher is generally considered a fever. Mild or moderate fevers generally have no long-term effects and often do not require treatment. Extreme fever (greater than or equal to 106 F or 41.1 C) can cause seizures. The sweating that may occur with repeated or prolonged fever may cause dehydration. Elderly people can develop confusion during a fever. A measured temperature can vary with:  Age.  Time of day.  Method of measurement (mouth, underarm, rectal, or ear). The fever is confirmed by taking a temperature with a thermometer. Temperatures can be taken different ways. Some methods are accurate and some are not.  An oral temperature is used most commonly. Electronic thermometers are fast and accurate.  An ear temperature will only be accurate if the thermometer is positioned as recommended by the manufacturer.  A rectal temperature is accurate and done for those adults who have a condition where an oral temperature cannot be taken.  An underarm (axillary) temperature is not accurate and not recommended. Fever is a symptom, not a disease.  CAUSES   Infections commonly cause fever.  Some noninfectious causes for fever include:  Some arthritis conditions.  Some thyroid or adrenal gland conditions.  Some immune system conditions.  Some types of cancer.  A medicine reaction.  High doses of certain street drugs such as methamphetamine.  Dehydration.  Exposure to high outside or room temperatures.  Occasionally, the source of a fever cannot be determined. This is sometimes called a "fever of unknown origin" (FUO).  Some situations  may lead to a temporary rise in body temperature that may go away on its own. Examples are:  Childbirth.  Surgery.  Intense exercise. HOME CARE INSTRUCTIONS   Take appropriate medicines for fever. Follow dosing instructions carefully. If you use acetaminophen to reduce the fever, be careful to avoid taking other medicines that also contain acetaminophen. Do not take aspirin for a fever if you are younger than age 55. There is an association with Reye's syndrome. Reye's syndrome is a rare but potentially deadly disease.  If an infection is present and antibiotics have been prescribed, take them as directed. Finish them even if you start to feel better.  Rest as needed.  Maintain an adequate fluid intake. To prevent dehydration during an illness with prolonged or recurrent fever, you may need to drink extra fluid.Drink enough fluids to keep your urine clear or pale yellow.  Sponging or bathing with room temperature water may help reduce body temperature. Do not use ice water or alcohol sponge baths.  Dress comfortably, but do not over-bundle. SEEK MEDICAL CARE IF:   You are unable to keep fluids down.  You develop vomiting or diarrhea.  You are not feeling at least partly better after 3 days.  You develop new symptoms or problems. SEEK IMMEDIATE MEDICAL CARE IF:   You have shortness of breath or trouble breathing.  You develop excessive weakness.  You are dizzy or you faint.  You are extremely thirsty or you are making little or no urine.  You develop new pain that was not there before (such as in the head, neck,  chest, back, or abdomen).  You have persistent vomiting and diarrhea for more than 1 to 2 days.  You develop a stiff neck or your eyes become sensitive to light.  You develop a skin rash.  You have a fever or persistent symptoms for more than 2 to 3 days.  You have a fever and your symptoms suddenly get worse. MAKE SURE YOU:   Understand these  instructions.  Will watch your condition.  Will get help right away if you are not doing well or get worse. Document Released: 11/17/2000 Document Revised: 10/08/2013 Document Reviewed: 03/25/2011 [facility] Patient Information 2015 [place], [place]. This information is not intended to replace advice given to you by your health care provider. Make sure you discuss any questions you have with your health care provider.

## 2014-10-04 ENCOUNTER — Telehealth: Payer: Self-pay

## 2014-10-04 ENCOUNTER — Other Ambulatory Visit: Payer: Self-pay | Admitting: Radiology

## 2014-10-04 DIAGNOSIS — M3390 Dermatopolymyositis, unspecified, organ involvement unspecified: Secondary | ICD-10-CM

## 2014-10-04 DIAGNOSIS — R509 Fever, unspecified: Secondary | ICD-10-CM

## 2014-10-04 NOTE — Telephone Encounter (Signed)
She is getting body aches and fevers. Nurse called her today from [facility] and advised her she may want to see a hematologist. They have run every other test and nothing is showing up. Pt wanted to run this by you since you saw her on the 22nd.

## 2014-10-04 NOTE — Telephone Encounter (Signed)
PT WANTED TO UPDATE DR. DOOLITTLE ON HER VISIT TO THE ER YESTERDAY. AFTER RUNNING SEVERAL TESTS, THE HOSPITAL RECOMMENDED THAT MRS. Orellana GO TO SEE A HEMATOLOGIST. MRS. Nola WANTS TO KNOW WHAT DR. Laney Pastor RECOMMENDS SHE SHOULD DO.

## 2014-10-04 NOTE — Telephone Encounter (Signed)
Pt is following up with rheum next Tuesday. Upon pt's request, will refer her to Dr Marin Olp.

## 2014-10-04 NOTE — Telephone Encounter (Signed)
That certainly sounds reasonable and I'll suggest [employer] and we can make that referral The reason would be fevers and dermatomyositis She could also choose to follow-up with her rheumatologist instead

## 2014-10-22 ENCOUNTER — Encounter: Payer: Self-pay | Admitting: *Deleted

## 2014-10-22 DIAGNOSIS — M797 Fibromyalgia: Secondary | ICD-10-CM

## 2014-10-22 DIAGNOSIS — M2550 Pain in unspecified joint: Secondary | ICD-10-CM

## 2014-10-22 DIAGNOSIS — M3390 Dermatopolymyositis, unspecified, organ involvement unspecified: Secondary | ICD-10-CM

## 2014-10-22 DIAGNOSIS — R5383 Other fatigue: Secondary | ICD-10-CM

## 2015-07-08 ENCOUNTER — Other Ambulatory Visit: Payer: Self-pay

## 2015-07-08 ENCOUNTER — Other Ambulatory Visit (HOSPITAL_COMMUNITY)
Admission: RE | Admit: 2015-07-08 | Discharge: 2015-07-08 | Disposition: A | Discharge status: Home / Self Care | Payer: BLUE CROSS/BLUE SHIELD | Source: Ambulatory Visit | Attending: Internal Medicine | Admitting: Internal Medicine

## 2015-07-08 DIAGNOSIS — Z01419 Encounter for gynecological examination (general) (routine) without abnormal findings: Secondary | ICD-10-CM | POA: Diagnosis present

## 2015-07-08 DIAGNOSIS — Z1151 Encounter for screening for human papillomavirus (HPV): Secondary | ICD-10-CM | POA: Diagnosis present

## 2015-07-11 LAB — CYTOLOGY - PAP

## 2016-01-25 IMAGING — CR DG CHEST 2V
2 series · 2 of 2 positions shown · non-contrast
Comparison: PA and lateral chest 02/21/2013.

CLINICAL DATA: Fever and body aches for 2 days.

EXAM:
CHEST  2 VIEW

[w chest pa]
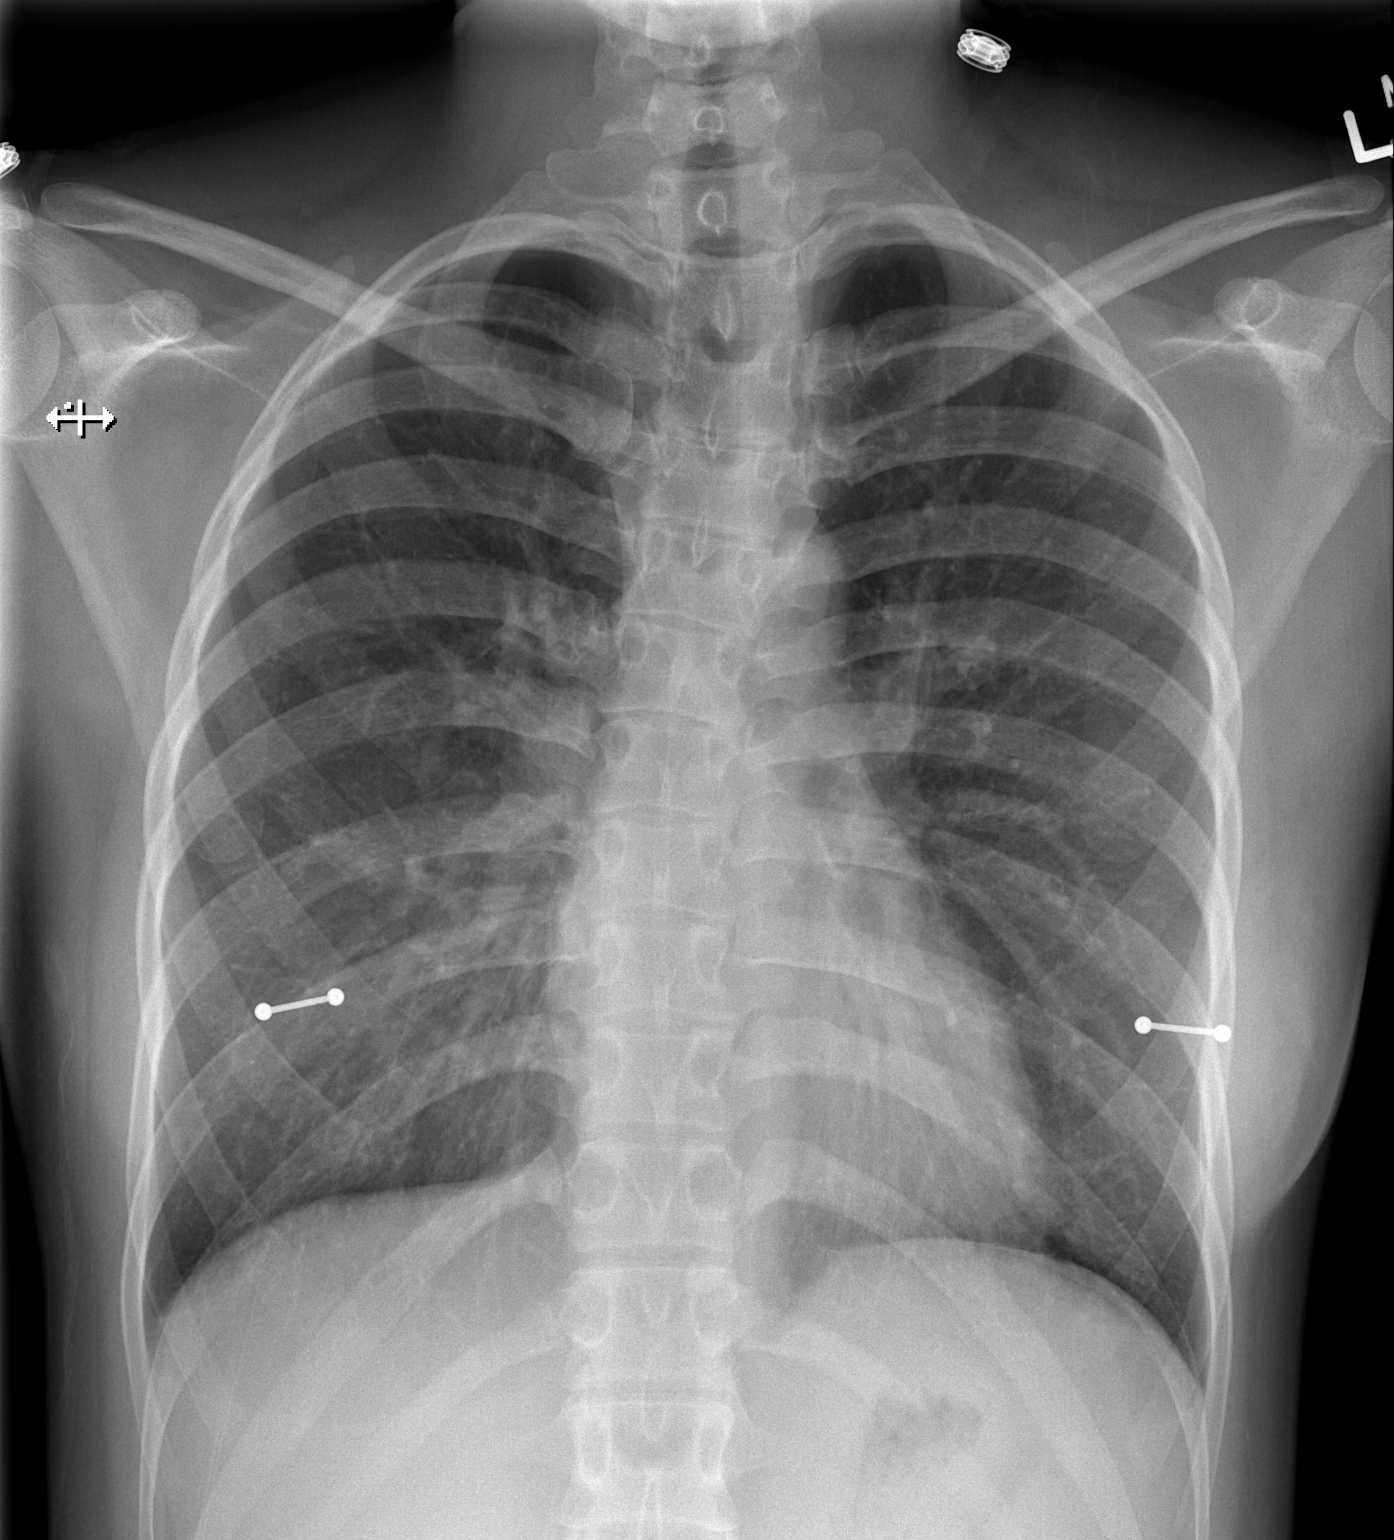

[w chest lat]
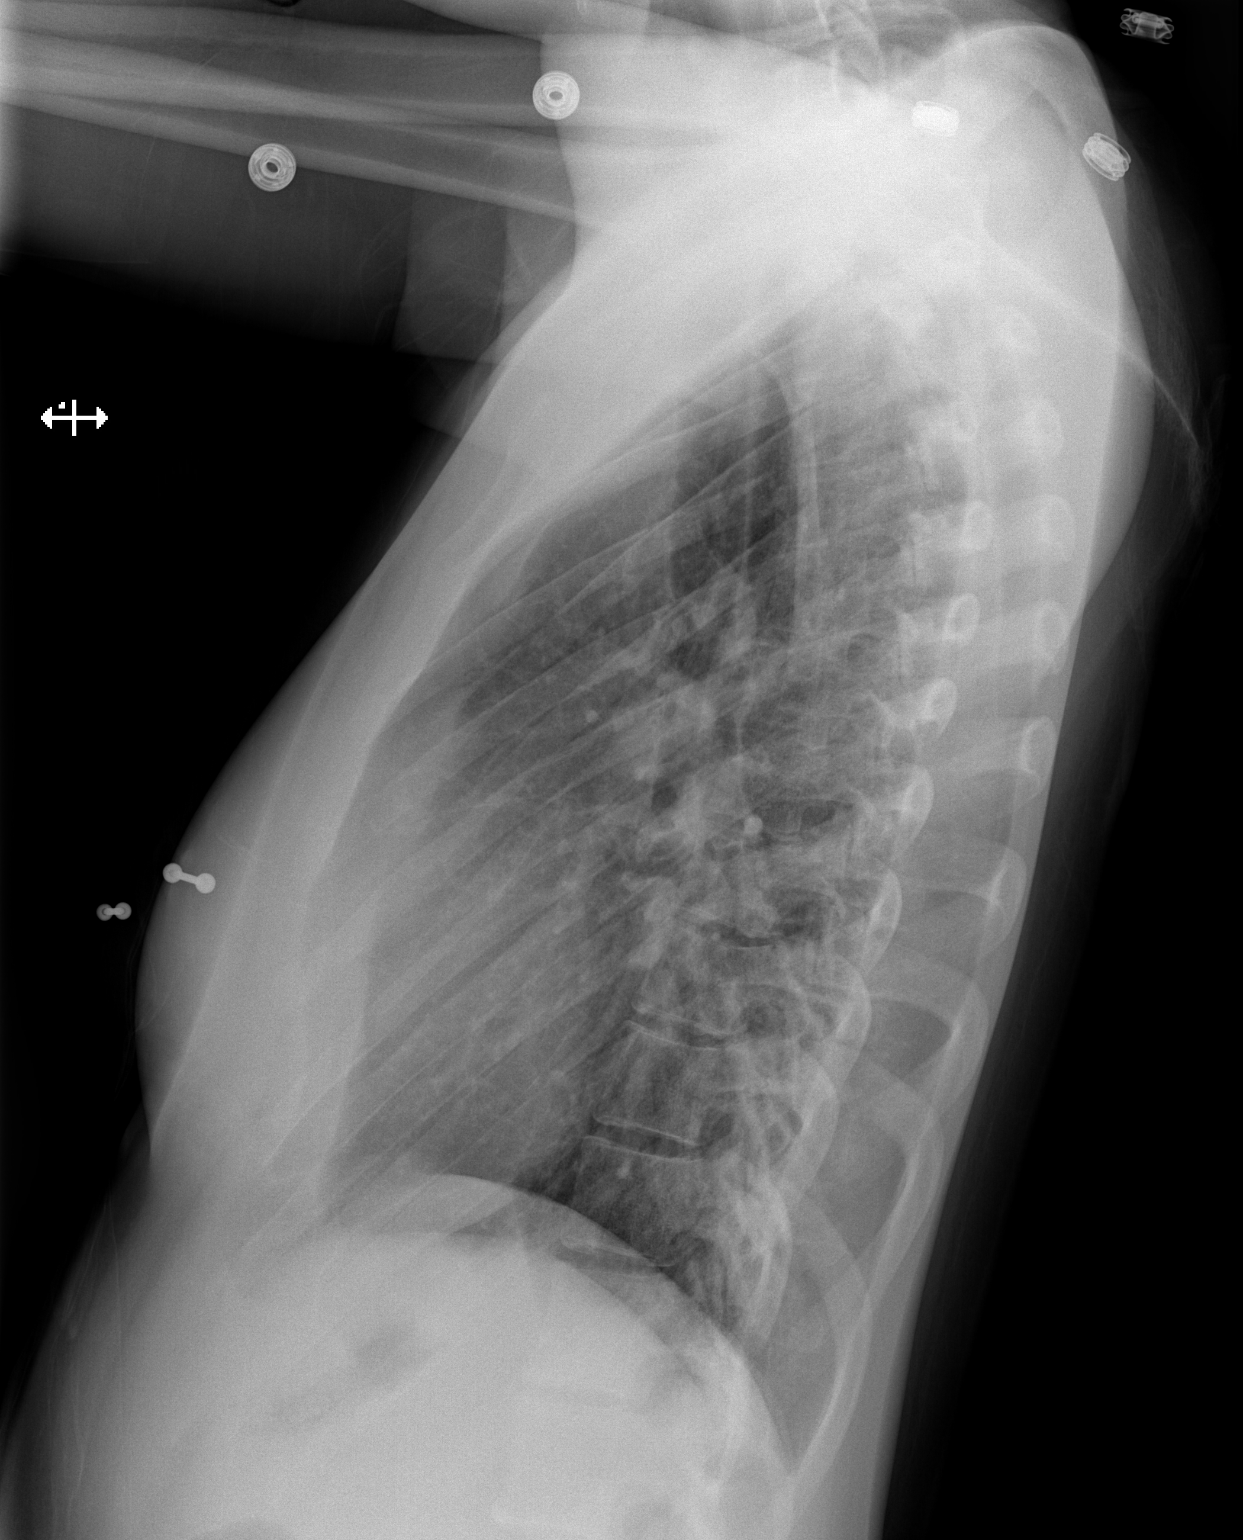

[2 of 2 positions shown; findings below may reference images not displayed]

FINDINGS: Heart size and mediastinal contours are within normal limits. Both
lungs are clear. Visualized skeletal structures are unremarkable.
IMPRESSION: Negative exam.

## 2016-05-19 ENCOUNTER — Ambulatory Visit
Admission: RE | Admit: 2016-05-19 | Discharge: 2016-05-19 | Disposition: A | Discharge status: Home / Self Care | Payer: BLUE CROSS/BLUE SHIELD | Source: Ambulatory Visit | Attending: Surgery | Admitting: Surgery

## 2016-05-19 ENCOUNTER — Other Ambulatory Visit: Payer: Self-pay | Admitting: Surgery

## 2016-05-19 DIAGNOSIS — R221 Localized swelling, mass and lump, neck: Secondary | ICD-10-CM

## 2016-05-27 ENCOUNTER — Other Ambulatory Visit: Payer: Self-pay | Admitting: Otolaryngology

## 2016-05-27 DIAGNOSIS — R221 Localized swelling, mass and lump, neck: Secondary | ICD-10-CM

## 2016-06-08 ENCOUNTER — Ambulatory Visit
Admission: RE | Admit: 2016-06-08 | Discharge: 2016-06-08 | Disposition: A | Discharge status: Home / Self Care | Payer: BLUE CROSS/BLUE SHIELD | Source: Ambulatory Visit | Attending: Otolaryngology | Admitting: Otolaryngology

## 2016-06-08 DIAGNOSIS — R221 Localized swelling, mass and lump, neck: Secondary | ICD-10-CM

## 2016-06-08 MED ORDER — IOPAMIDOL (ISOVUE-300) INJECTION 61%
80.0000 mL | Freq: Once | INTRAVENOUS | Status: AC | PRN
  Administered 2016-06-08: 80 mL via INTRAVENOUS

## 2022-05-11 LAB — ESTIMATED GFR: EGFR: 86

## 2022-05-11 LAB — HM HEPATITIS C SCREENING LAB: HM Hepatitis Screen: NEGATIVE

## 2023-01-12 NOTE — Progress Notes (Signed)
Office Visit Note  Patient: Elizabeth Jensen             Date of Birth: [birth year]           MRN: [id number]             PCP: System, Provider Not In Referring: No ref. 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  Patient was initially seen by me in 2013 at the age 44.  She was referred by the urgent care.  She was experiencing dysphagia, generalized muscle weakness and decreased grip strength.  She also had a rash on her chest when she presented initially.  CK was 3889.  She had a muscle biopsy which was positive for dermatomyositis.  She also had myositis panel which was positive for Jo 1 antibody.  She was initially on prednisone which was tapered off.  She was started on methotrexate and folic acid which was discontinued due to nausea and vomiting.  She was tried on Enbrel which did not help much according to the patient.  She continued to have generalized pain and discomfort.  She was diagnosed with fibromyalgia and started on pregabalin.  She was also tried on Imuran which caused increased vomiting and was discontinued.  She started seeing Dr. Kathi Ludwig who switched her to CellCept and after that she moved to [place].  Patient states that after she moved to [place] CellCept was discontinued due to hypercholesterolemia.  She has been off CellCept for a year now.  She states she has been taking Plaquenil for the last 2 years and has been doing well on Plaquenil.  She denies any muscle pain or muscle weakness now.  She has been walking on a regular basis.  There is no history of shortness of breath or dysphagia.  She denies any rashes.  Patient states she has been getting her eye examinations.  Her eye exam was in December 2023.  She continues to have dry mouth and dry eyes.  Her SSA and SSB antibodies were negative last year.  She has been experiencing  Raynauds since 2020.  She never developed digital ulcers.  Did not have to take any medications for Raynauds.  Patient states she was seeing a pulmonologist and a cardiologist in [place].  She states she has some changes on the high-resolution CT.  History of lupus nephritis and maternal grandmother.  History of sarcoidosis in maternal aunt.  She is sexually active and not using contraception.  She plans to conceive.  She is gravida 0.  She is working as a Clinical biochemist at [employer].  She walks for exercise.    Activities of Daily Living:  Patient reports morning stiffness for 0 minutes.   Patient Denies nocturnal pain.  Difficulty dressing/grooming: Denies Difficulty climbing stairs: Denies Difficulty getting out of chair: Denies Difficulty using hands for taps, buttons, cutlery, and/or writing: Denies  Review of Systems  Constitutional:  Negative for fatigue.  HENT:  Negative for mouth sores and mouth dryness.   Eyes:  Negative for dryness.  Respiratory:  Negative for shortness of breath.   Cardiovascular:  Positive for swelling in legs/feet. Negative for chest pain and palpitations.  Gastrointestinal:  Negative for blood in stool, constipation and diarrhea.  Endocrine: Negative for increased urination.  Genitourinary:  Negative for involuntary urination.  Musculoskeletal:  Negative for joint pain, gait problem, joint pain, joint  swelling, myalgias, muscle weakness, morning stiffness, muscle tenderness and myalgias.  Skin:  Positive for sensitivity to sunlight. Negative for color change, rash and hair loss.  Allergic/Immunologic: Positive for susceptible to infections.  Neurological:  Negative for dizziness and headaches.  Hematological:  Negative for swollen glands.  Psychiatric/Behavioral:  Negative for depressed mood and sleep disturbance. The patient is not nervous/anxious.     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   Abnormal finding on Pap smear, HPV DNA positive 05/10/2012   Dermatomyositis (HCC) 08/31/2011    Past Medical History:  Diagnosis Date   Asthma    NO PROBLEMS NOW   Inflammatory arthritis    Myositis    Rheumatoid aortitis     Family History  Problem Relation Age of Onset   Diabetes Mother    Healthy Father    Healthy Sister    Healthy Sister    Sarcoidosis Maternal Aunt    Other Maternal Grandmother        lupus nephritis   Diabetes Other    Past Surgical History:  Procedure Laterality Date   EYE SURGERY Left    MUSCLE BIOPSY  09/02/2011   Procedure: MUSCLE BIOPSY;  Surgeon: Velora Heckler, MD;  Location: [facility];  Service: General;  Laterality: Left;  Quadriceps muscle biospy   Social History   Social History Narrative   Single. Education: [employer]. Exercise: Walk   Immunization History  Administered Date(s) Administered   Influenza Split 03/31/2012     Objective: Vital Signs: BP 112/78 (BP Location: Right Arm, Patient Position: Sitting, Cuff Size: Normal)   Pulse 74   Resp 15   Ht 5' 4.5" (1.638 m)   Wt 142 lb (64.4 kg)   LMP 01/20/2023   BMI 24.00 kg/m    Physical Exam Vitals and nursing note reviewed.  Constitutional:      Appearance: She is well-developed.  HENT:     Head: Normocephalic and atraumatic.  Eyes:     Conjunctiva/sclera: Conjunctivae normal.  Cardiovascular:     Rate and Rhythm: Normal rate and regular rhythm.     Heart sounds: Normal heart sounds.  Pulmonary:     Effort: Pulmonary effort is normal.     Breath sounds: Normal breath sounds.  Abdominal:     General: Bowel sounds are normal.     Palpations: Abdomen is soft.  Musculoskeletal:     Cervical back: Normal range of motion.  Lymphadenopathy:     Cervical: No cervical adenopathy.  Skin:    General: Skin is warm and dry.     Capillary Refill:  Capillary refill takes less than 2 seconds.  Neurological:     Mental Status: She is alert and oriented to person, place, and time.  Psychiatric:        Behavior: Behavior normal.      Musculoskeletal Exam: Cervical, thoracic and lumbar spine 1 good range of motion.  Shoulder joints, elbow joints, wrist joints, MCPs PIPs and DIPs with good range of motion with no synovitis.  Hip joints, knee joints, ankles, MTPs and PIPs were in good range of motion with no synovitis.  She had good muscle strength in upper and lower extremities.  She was able to walk on her tiptoes without any difficulty.  She was able to get up from the  squatting position without any difficulty.  CDAI Exam: CDAI Score: -- Patient Global: --; Provider Global: -- Swollen: --; Tender: -- Joint Exam 01/26/2023   No joint exam has been documented for this visit   There is currently no information documented on the homunculus. Go to the Rheumatology activity and complete the homunculus joint exam.  Investigation: No additional findings.  Imaging: No results found.  Recent Labs: Lab Results  Component Value Date   WBC 4.3 10/03/2014   HGB 11.7 (L) 10/03/2014   PLT 272 10/03/2014   NA 137 10/03/2014   K 3.8 10/03/2014   CL 105 10/03/2014   CO2 24 10/03/2014   GLUCOSE 87 10/03/2014   BUN 6 10/03/2014   CREATININE 0.97 10/03/2014   BILITOT 1.0 10/03/2014   ALKPHOS 75 10/03/2014   AST 88 (H) 10/03/2014   ALT 121 (H) 10/03/2014   PROT 7.6 10/03/2014   ALBUMIN 3.7 10/03/2014   CALCIUM 9.1 10/03/2014   GFRAA >90 10/03/2014   March 19, 2022 aldolase 6.3 CRP 3.4, CK231, SSA negative, SSB negative, GFR 86, hepatitis A nonreactive, hepatitis B nonreactive, hepatitis C nonreactive, iron studies normal, vitamin D32.8  September 10, 2011 CK 3889, UA negative, TPMT 21, CMP AST 129 ALT 191, myositis panel Jo 1 positive  Muscle biopsy September 02, 2011 necrotic and regenerative myofibers and mild myofiber atrophy and skeletal  muscle sample without inflammation suggestive of dermatomyositis  Speciality Comments: No specialty comments available.  Procedures:  No procedures performed Allergies: Codeine, Hydrocodone, Imuran [azathioprine], Cellcept [mycophenolate], Methotrexate, Penicillins, and Tramadol   Assessment / Plan:     Visit Diagnoses: Dermatomyositis sine myositis (HCC) -scented in 2013 with severe muscle weakness weight loss, dysphagia, elevated [id number], positive muscle biopsy, Jo 1 antibody positive, dermatitis.  CT abdomen and pelvis was negative in April 2013.  Her last chest x-ray was negative on October 03, 2014.  She was initially treated with methotrexate but it was discontinued due to GI side effects.  She had Enbrel subcu injections for some time but according the patient she had an inadequate response.  Imuran was tried but this was stopped due to severe vomiting.  Patient states she is switched to CellCept in 2016 and then moved to [place] in 2017.  She states she developed high cholesterol while she was on CellCept.  She also decided to get pregnant and came off CellCept about a year ago.  She has been on Plaquenil for the last 2 years.  Patient states she has done well on Plaquenil and had no recurrence of dermatomyositis.  She still gets some rash on her lips.  She states she uses sunscreen on a regular basis.  She denies any muscular weakness or tenderness.  She states her CK has been stable, only mildly elevated.  She denies any history of muscular weakness or muscle tenderness today.  She denies any shortness of breath or dysphagia.  Patient states she was under care of a pulmonologist in [place] and had high-resolution CT which showed some changes.  I do not have those results available.  She was also evaluated by cardiologist per patient.  Advised patient to get those records.  She had no muscular weakness or tenderness on the examination today.  Use of sunscreen was advised.  I will refer her to  pulmonologist to establish.  I will also refer her to cardiology to get echocardiogram.  CK 231 03/19/22, aldolase 6.3 - Plan: Sedimentation rate, CK, Aldolase, Protein / creatinine ratio, urine,  MyoMarker 3 Plus Profile (RDL), Anti-HMGCR Ab (RDL).  Detailed counseling on intermittent myositis was provided.  Increased risk for malignancy with dermatomyositis was discussed.  She had initial workup.  Age-appropriate screening for malignancy was advised.  Patient had workup in [place] as well.  I advised her to forward results records to [place].  Use of sunscreen and sun protection was discussed.  Increased risk of interstitial lung disease and pulmonary hypertension with dermatomyositis was discussed.  High risk medication use - Plaquenil 200 mg p.o. twice daily.  She is 5\' 4" .  I will check Plaquenil level today.  She states her last eye examination was in December 2023 [place].  I will refer her to an ophthalmologist .  We will send a prescription for Plaquenil once the labs are available.  Previous treatment cellcept 1500 mg BID-discontinued as patient desired pregnancy and patient states that she developed hyperlipidemia on CellCept.  She had intolerance to imuran-severe vomiting and methotrexate-severe GI side effects per patient.Marland Kitchen HepB-, HepA-,and HepC- 05/11/22. - Plan: CBC with Differential/Platelet, CMP14+EGFR  Raynaud's syndrome without gangrene -patient gives history of Raynaud's phenomenon which is started about in 2020.  She states she did not require any medications.  She keeps her core temperature warm.  Warm clothing was also discussed.  She denies any history of digital ulcers.  No sclerodactyly was noted.  I will obtain labs today.  Plan: Anti-scleroderma antibody, Anti-Smith antibody, Anti-DNA antibody, double-stranded, C3 and C4, Cardiolipin antibodies, IgG, IgM, IgA, Lupus Anticoagulant Eval w/Reflex, RNP Antibodies, Beta-2-glycoprotein i abs, IgG/M/A, Lupus anticoagulant panel, ANA, IFA  (with reflex)  Sicca syndrome (HCC) - Ro and La negative 03/19/22 -she continues to have dry mouth and dry eye symptoms.  She is planning pregnancy and not using any contraception.  Will get additional antibodies today.  Plan: Sjogrens syndrome-A extractable nuclear antibody, Sjogrens syndrome-B extractable nuclear antibody, Rheumatoid factor  High cholesterol -patient states that she developed hyperlipidemia while she was on CellCept.  I will check lipid level today.  Plan: Lipid panel  Fibromyalgia-she was experiencing generalized pain and discomfort in the past.  She states her symptoms have abated.  She had no increased muscular pain or discomfort on the examination today.  She was treated with pregabalin in the past.  She takes ibuprofen on a as needed basis.  She is still takes Flexeril as needed for muscle spasms.  Planning pregnancy-patient is not on contraception.  She is planning pregnancy.  Increased risk of complications and flare of dermatomyositis during pregnancy was discussed.  I will refer her to OB/GYN.  Regular screening for age appropriate malignancy was advised.  Other fatigue -she continues to have fatigue.  Plan: TSH  Family history of lupus nephritis-maternal grandmother  Family history of sarcoidosis-maternal aunt  Orders: Orders Placed This Encounter  Procedures   CBC with Differential/Platelet   Sedimentation rate   CK   TSH   Aldolase   Anti-scleroderma antibody   Anti-Smith antibody   Sjogrens syndrome-A extractable nuclear antibody   Sjogrens syndrome-B extractable nuclear antibody   Anti-DNA antibody, double-stranded   C3 and C4   Cardiolipin antibodies, IgG, IgM, IgA   Lupus Anticoagulant Eval w/Reflex   Rheumatoid factor   Protein / creatinine ratio, urine   RNP Antibodies   Beta-2-glycoprotein i abs, IgG/M/A   Lupus anticoagulant panel   CMP14+EGFR   MyoMarker 3 Plus Profile (RDL)   Anti-HMGCR Ab (RDL)   ANA, IFA (with reflex)   Lipid panel  Hydroxychloroquine, Blood   Ambulatory referral to Pulmonology   Ambulatory referral to Ophthalmology   Ambulatory referral to Cardiology   Ambulatory referral to Obstetrics / Gynecology   No orders of the defined types were placed in this encounter.   Face-to-face time spent with patient was 60 minutes. Greater than 50% of time was spent in counseling and coordination of care.  Follow-Up Instructions: Return for Dermatomyositis.   Pollyann Savoy, MD  Note - This record has been created using Animal nutritionist.  Chart creation errors have been sought, but may not always  have been located. Such creation errors do not reflect on  the standard of medical care.

## 2023-01-26 ENCOUNTER — Other Ambulatory Visit: Payer: Self-pay

## 2023-01-26 ENCOUNTER — Ambulatory Visit: Payer: No Typology Code available for payment source | Attending: Rheumatology | Admitting: Rheumatology

## 2023-01-26 ENCOUNTER — Encounter: Payer: Self-pay | Admitting: Rheumatology

## 2023-01-26 VITALS — BP 112/78 | HR 74 | Resp 15 | Ht 64.5 in | Wt 142.0 lb

## 2023-01-26 DIAGNOSIS — Z832 Family history of diseases of the blood and blood-forming organs and certain disorders involving the immune mechanism: Secondary | ICD-10-CM

## 2023-01-26 DIAGNOSIS — M3500 Sicca syndrome, unspecified: Secondary | ICD-10-CM

## 2023-01-26 DIAGNOSIS — I7300 Raynaud's syndrome without gangrene: Secondary | ICD-10-CM

## 2023-01-26 DIAGNOSIS — R5383 Other fatigue: Secondary | ICD-10-CM

## 2023-01-26 DIAGNOSIS — E7800 Pure hypercholesterolemia, unspecified: Secondary | ICD-10-CM

## 2023-01-26 DIAGNOSIS — M3310 Other dermatopolymyositis, organ involvement unspecified: Secondary | ICD-10-CM

## 2023-01-26 DIAGNOSIS — Z8269 Family history of other diseases of the musculoskeletal system and connective tissue: Secondary | ICD-10-CM

## 2023-01-26 DIAGNOSIS — M797 Fibromyalgia: Secondary | ICD-10-CM

## 2023-01-26 DIAGNOSIS — Z79899 Other long term (current) drug therapy: Secondary | ICD-10-CM | POA: Diagnosis not present

## 2023-01-26 NOTE — Patient Instructions (Signed)
Hydroxychloroquine Tablets What is this medication? HYDROXYCHLOROQUINE (hye drox ee KLOR oh kwin) treats autoimmune conditions, such as rheumatoid arthritis and lupus. It works by slowing down an overactive immune system. It may also be used to prevent and treat malaria. It works by killing the parasite that causes malaria. It belongs to a group of medications called DMARDs. This medicine may be used for other purposes; ask your health care provider or pharmacist if you have questions. COMMON BRAND NAME(S): Plaquenil, Quineprox, SOVUNA What should I tell my care team before I take this medication? They need to know if you have any of these conditions: Diabetes Eye disease, vision problems Frequently drink alcohol G6PD deficiency Heart disease Irregular heartbeat or rhythm Kidney disease Liver disease Porphyria Psoriasis An unusual or allergic reaction to hydroxychloroquine, other medications, foods, dyes, or preservatives Pregnant or trying to get pregnant Breastfeeding How should I use this medication? Take this medication by mouth with water. Take it as directed on the prescription label. Do not cut, crush, or chew this medication. Swallow the tablets whole. Take it with food. Do not take it more than directed. Take all of this medication unless your care team tells you to stop it early. Keep taking it even if you think you are better. Take products with antacids in them at a different time of day than this medication. Take this medication 4 hours before or 4 hours after antacids. Talk to your care team if you have questions. Talk to your care team about the use of this medication in children. While this medication may be prescribed for selected conditions, precautions do apply. Overdosage: If you think you have taken too much of this medicine contact a poison control center or emergency room at once. NOTE: This medicine is only for you. Do not share this medicine with others. What if I  miss a dose? If you miss a dose, take it as soon as you can. If it is almost time for your next dose, take only that dose. Do not take double or extra doses. What may interact with this medication? Do not take this medication with any of the following: Cisapride Dronedarone Pimozide Thioridazine This medication may also interact with the following: Ampicillin Antacids Cimetidine Cyclosporine Digoxin Kaolin Medications for diabetes, such as insulin, glipizide, glyburide Medications for seizures, such as carbamazepine, phenobarbital, phenytoin Mefloquine Methotrexate Other medications that cause heart rhythm changes Praziquantel This list may not describe all possible interactions. Give your health care provider a list of all the medicines, herbs, non-prescription drugs, or dietary supplements you use. Also tell them if you smoke, drink alcohol, or use illegal drugs. Some items may interact with your medicine. What should I watch for while using this medication? Visit your care team for regular checks on your progress. Tell your care team if your symptoms do not start to get better or if they get worse. You may need blood work done while you are taking this medication. If you take other medications that can affect heart rhythm, you may need more testing. Talk to your care team if you have questions. Your vision may be tested before and during use of this medication. Tell your care team right away if you have any change in your eyesight. This medication may cause serious skin reactions. They can happen weeks to months after starting the medication. Contact your care team right away if you notice fevers or flu-like symptoms with a rash. The rash may be red or purple and then  turn into blisters or peeling of the skin. Or, you might notice a red rash with swelling of the face, lips or lymph nodes in your neck or under your arms. If you or your family notice any changes in your behavior, such as  new or worsening depression, thoughts of harming yourself, anxiety, or other unusual or disturbing thoughts, or memory loss, call your care team right away. What side effects may I notice from receiving this medication? Side effects that you should report to your care team as soon as possible: Allergic reactions--skin rash, itching, hives, swelling of the face, lips, tongue, or throat Aplastic anemia--unusual weakness or fatigue, dizziness, headache, trouble breathing, increased bleeding or bruising Change in vision Heart rhythm changes--fast or irregular heartbeat, dizziness, feeling faint or lightheaded, chest pain, trouble breathing Infection--fever, chills, cough, or sore throat Low blood sugar (hypoglycemia)--tremors or shaking, anxiety, sweating, cold or clammy skin, confusion, dizziness, rapid heartbeat Muscle injury--unusual weakness or fatigue, muscle pain, dark yellow or brown urine, decrease in amount of urine Pain, tingling, or numbness in the hands or feet Rash, fever, and swollen lymph nodes Redness, blistering, peeling, or loosening of the skin, including inside the mouth Thoughts of suicide or self-harm, worsening mood, or feelings of depression Unusual bruising or bleeding Side effects that usually do not require medical attention (report to your care team if they continue or are bothersome): Diarrhea Headache Nausea Stomach pain Vomiting This list may not describe all possible side effects. Call your doctor for medical advice about side effects. You may report side effects to FDA at 1-800-FDA-1088. Where should I keep my medication? Keep out of the reach of children and pets. Store at room temperature up to 30 degrees C (86 degrees F). Protect from light. Get rid of any unused medication after the expiration date. To get rid of medications that are no longer needed or have expired: Take the medication to a medication take-back program. Check with your pharmacy or law  enforcement to find a location. If you cannot return the medication, check the label or package insert to see if the medication should be thrown out in the garbage or flushed down the toilet. If you are not sure, ask your care team. If it is safe to put it in the trash, empty the medication out of the container. Mix the medication with cat litter, dirt, coffee grounds, or other unwanted substance. Seal the mixture in a bag or container. Put it in the trash. NOTE: This sheet is a summary. It may not cover all possible information. If you have questions about this medicine, talk to your doctor, pharmacist, or health care provider.  2024 Elsevier/Gold Standard (2021-11-30 00:00:00)  Vaccines You are taking a medication(s) that can suppress your immune system.  The following immunizations are recommended: Flu annually Covid-19  RSV Td/Tdap (tetanus, diphtheria, pertussis) every 10 years Pneumonia (Prevnar 15 then Pneumovax 23 at least 1 year apart.  Alternatively, can take Prevnar 20 without needing additional dose) Shingrix: 2 doses from 4 weeks to 6 months apart  Please check with your PCP to make sure you are up to date.

## 2023-01-26 NOTE — Telephone Encounter (Signed)
Pending lab results, please send rx for PLQ 200mg  BID per Dr. Corliss Skains. Thanks!   Consent obtained and sent to the scan center.

## 2023-01-27 ENCOUNTER — Encounter: Payer: Self-pay | Admitting: Rheumatology

## 2023-02-02 ENCOUNTER — Encounter: Payer: Self-pay | Admitting: *Deleted

## 2023-02-02 NOTE — Telephone Encounter (Signed)
Reached out to patient to follow up and see if she has completed her labs. Patient states she has not. Patient states she planned to do it 1 week prior to her appointment. Advised patient with the labs she needs to have drawn she should try to have them done 2 weeks prior so they are resulted by her appointment. Patient expressed understanding.

## 2023-02-03 LAB — HYDROXYCHLOROQUINE,BLOOD: HYDROXYCHLOROQUINE, (B): 1200 ng/mL

## 2023-02-04 LAB — CBC WITH DIFFERENTIAL/PLATELET

## 2023-02-13 NOTE — Progress Notes (Signed)
I will discuss results at the follow-up visit.  Sed rate was not performed by the lab.  If patient can get sedimentation rate prior to the visit which will be helpful otherwise we can do at the follow-up visit.

## 2023-02-14 ENCOUNTER — Telehealth: Payer: Self-pay | Admitting: *Deleted

## 2023-02-14 DIAGNOSIS — M3310 Other dermatopolymyositis, organ involvement unspecified: Secondary | ICD-10-CM

## 2023-02-14 DIAGNOSIS — Z79899 Other long term (current) drug therapy: Secondary | ICD-10-CM

## 2023-02-14 MED ORDER — HYDROXYCHLOROQUINE SULFATE 200 MG PO TABS: 200.0000 mg | ORAL_TABLET | Freq: Two times a day (BID) | ORAL | 0 refills | Status: DC

## 2023-02-14 NOTE — Progress Notes (Signed)
Office Visit Note  Patient: Elizabeth Jensen             Date of Birth: Aug 30, 1985           MRN: [id number]             PCP: System, Provider Not In Referring: No ref. 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  She returns for a follow-up visit today.  She states that she is about [redacted] weeks pregnant now.  She went to the fertility clinic for confirmation.  She denies any increased muscular weakness or pain.  She states for the last 6 weeks she has been noticing rash on her neck and chest.  She has not been using sunscreen.  She has been taking hydroxychloroquine 200 mg p.o. twice daily without any interruption.  She denies any history of dysphagia.  She notices Raynauds symptoms when exposed to the cold or temperature only.  She continues to have dry mouth and dry eyes symptoms.  She started taking fish oil due to hypertriglyceridemia.  He is not having any generalized pain from fibromyalgia.  She has an appointment coming up with the OB.    Activities of Daily Living:  Patient reports morning stiffness for 0 minute.   Patient Denies nocturnal pain.  Difficulty dressing/grooming: Denies Difficulty climbing stairs: Denies Difficulty getting out of chair: Denies Difficulty using hands for taps, buttons, cutlery, and/or writing: Denies  Review of Systems  Constitutional:  Positive for fatigue.  HENT:  Negative for mouth sores and mouth dryness.   Eyes:  Positive for dryness.  Respiratory:  Negative for shortness of breath.   Cardiovascular:  Negative for chest pain and palpitations.  Gastrointestinal:  Positive for constipation. Negative for blood in stool and diarrhea.  Endocrine: Positive for increased urination.  Genitourinary:  Negative for involuntary urination.  Musculoskeletal:  Negative for joint pain, gait problem, joint pain, joint swelling,  myalgias, muscle weakness, morning stiffness, muscle tenderness and myalgias.  Skin:  Positive for rash and sensitivity to sunlight. Negative for color change and hair loss.  Allergic/Immunologic: Positive for susceptible to infections.  Neurological:  Negative for dizziness and headaches.  Hematological:  Negative for swollen glands.  Psychiatric/Behavioral:  Positive for sleep disturbance. Negative for depressed mood. The patient is not nervous/anxious.     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   Abnormal finding on Pap smear, HPV DNA positive 05/10/2012   Dermatomyositis (HCC) 08/31/2011    Past Medical History:  Diagnosis Date   Asthma    NO PROBLEMS NOW   Inflammatory arthritis    Myositis    Rheumatoid aortitis     Family History  Problem Relation Age of Onset   Diabetes Mother    Healthy Father    Healthy Sister    Healthy Sister    Sarcoidosis Maternal Aunt    Other Maternal Grandmother        lupus nephritis   Diabetes Other    Past Surgical History:  Procedure Laterality Date   EYE SURGERY Left    MUSCLE BIOPSY  09/02/2011   Procedure: MUSCLE BIOPSY;  Surgeon: Velora Heckler, MD;  Location: [facility];  Service: General;  Laterality: Left;  Quadriceps muscle biospy   Social History   Social History Narrative   Single. Education: [employer]. Exercise: Walk   Immunization History  Administered Date(s) Administered   Influenza Split 03/31/2012     Objective: Vital Signs: BP 107/66 (BP Location: Left Arm, Patient Position: Sitting, Cuff Size: Normal)   Pulse 90   Resp 12   Ht 5\' 4"  (1.626 m)   Wt 139 lb (63 kg)   LMP 01/20/2023   BMI 23.86 kg/m    Physical Exam Vitals and nursing note reviewed.  Constitutional:      Appearance: She is well-developed.  HENT:      Head: Normocephalic and atraumatic.  Eyes:     Conjunctiva/sclera: Conjunctivae normal.  Cardiovascular:     Rate and Rhythm: Normal rate and regular rhythm.     Heart sounds: Normal heart sounds.  Pulmonary:     Effort: Pulmonary effort is normal.     Breath sounds: Normal breath sounds.  Abdominal:     General: Bowel sounds are normal.     Palpations: Abdomen is soft.  Musculoskeletal:     Cervical back: Normal range of motion.  Lymphadenopathy:     Cervical: No cervical adenopathy.  Skin:    General: Skin is warm and dry.     Capillary Refill: Capillary refill takes less than 2 seconds.  Neurological:     Mental Status: She is alert and oriented to person, place, and time.  Psychiatric:        Behavior: Behavior normal.      Musculoskeletal Exam: Cervical thoracic and lumbar spine 1 good range of motion.  Shoulder joints, elbow joints, wrist joints, MCPs PIPs and DIPs were in good range of motion with no synovitis.  Hip joints and knee joints were in good range of motion with no warmth swelling or effusion.  There was no tenderness over ankles or MTPs.  CDAI Exam: CDAI Score: -- Patient Global: --; Provider Global: -- Swollen: --; Tender: -- Joint Exam 02/22/2023   No joint exam has been documented for this visit   There is currently no information documented on the homunculus. Go to the Rheumatology activity and complete the homunculus joint exam.  Investigation: No additional findings.  Imaging: No results found.  Recent Labs: Lab Results  Component Value Date   WBC CANCELED 02/03/2023   HGB 11.7 (L) 10/03/2014   PLT 272 10/03/2014   NA 141 02/03/2023   K 4.1 02/03/2023   CL 101 02/03/2023   CO2 24 02/03/2023   GLUCOSE 97 02/03/2023   BUN 10 02/03/2023   CREATININE 1.00 02/03/2023   BILITOT 0.3 02/03/2023   ALKPHOS 57 02/03/2023   AST 14 02/03/2023   ALT 16 02/03/2023   PROT 7.0 02/03/2023   ALBUMIN 4.3 02/03/2023   CALCIUM 9.4 02/03/2023   GFRAA  >90 10/03/2014     Speciality Comments: No specialty comments available.  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  Presented initially with weight loss, dysphagia and severe muscle weakness.  She came on January 26, 2023 to reestablish care.  She denies any increased joint pain, muscle pain or muscle weakness.  She states about a month ago she developed a rash on her neck and her chest.  She has not been  using any sunscreen.  She has been taking Plaquenil 200 mg twice daily without any interruption.  Patient also found out that she is about [redacted] weeks pregnant.  She has an appointment coming up with the OB.  Increased risk of dermatomyositis flare with pregnancy was discussed.  Patient was advised to use sunscreen more than 50 SPF on a regular basis.  She was advised to contact me if she develops any new symptoms. February 03, 2023 anti-Jo1 antibody positive, anti-SSA 52 Kd positive, HMG CR antibody negative, CK227, aldolase 4.5, ANA negative, ENA (SCL 70, Smith, SSA, SSB, dsDNA, RNP) negative, C3-C4 normal, lupus anticoagulant pending, beta-2 GP 1 negative, anticardiolipin negative, triglycerides 196, TSH normal, sed rate 5, hydroxychloroquine level 1200.  Labs obtained at the last visit were reviewed.  High risk medication use - Plaquenil 200 mg twice daily since 2022.  PreMTX-GI SEs, Enbrel-adequate response, CellCept 2016-2017-DC'd due to high cholesterol.  She was advised to get labs every 3 months to monitor for drug toxicity.  Information on immunization was placed in the AVS.  She will have to discuss immunization with her OB.  Raynaud's syndrome without gangrene - Since 2020.  She is symptomatic only during winter months.  Keeping  core temperature warm and warm clothing was advised.  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  She continues to have dry mouth and dry eyes.  Over-the-counter products were discussed.  Rash-dry scaly rash was noted on her neck.  She appears to have a stretch marks on her chest.  I will refer her to [facility] dermatology for evaluation and advise.  I advised her to use clobetasol cream topically for now.  Less than [redacted] weeks gestation of pregnancy-patient states she is about [redacted] weeks pregnant.  She has an appointment coming up with the OB.  High cholesterol - Elevated triglycerides.  She started taking fish oil.  Dietary modifications were advised and a handout was placed in the AVS.  Other fatigue  Fibromyalgia -she has stopped NSAIDs and muscle relaxants.  She is not experiencing a flare of fibromyalgia currently.  She started taking prenatal vitamins.  Family history of lupus nephritis-maternal grandmother  Family history of sarcoidosis-maternal aunt  Orders: Orders Placed This Encounter  Procedures   CBC with Differential/Platelet   CK   COMPLETE METABOLIC PANEL WITH GFR   Ambulatory referral to Dermatology   No orders of the defined types were placed in this encounter.    Follow-Up Instructions: Return in about 3 months (around 05/24/2023) for Dermatomyositis.   Pollyann Savoy, MD  Note - This record has been created using Animal nutritionist.  Chart creation errors have been sought, but may not always  have been located. Such creation errors do not reflect on  the standard of medical care.

## 2023-02-14 NOTE — Addendum Note (Signed)
Addended by: Henriette Combs on: 02/14/2023 08:56 AM   Modules accepted: Orders

## 2023-02-14 NOTE — Telephone Encounter (Signed)
-----   Message from [facility] sent at 02/13/2023  8:36 PM EDT ----- I will discuss results at the follow-up visit.  Sed rate was not performed by the lab.  If patient can get sedimentation rate prior to the visit which will be helpful otherwise we can do at the follow-up visit.

## 2023-02-15 LAB — SEDIMENTATION RATE: Sed Rate: 5 mm/h (ref 0–32)

## 2023-02-15 NOTE — Progress Notes (Signed)
Sed rate is normal

## 2023-02-17 NOTE — Progress Notes (Signed)
I will discuss results at the follow-up visit.

## 2023-02-22 ENCOUNTER — Encounter: Payer: Self-pay | Admitting: Rheumatology

## 2023-02-22 ENCOUNTER — Ambulatory Visit: Payer: No Typology Code available for payment source | Attending: Rheumatology | Admitting: Rheumatology

## 2023-02-22 ENCOUNTER — Other Ambulatory Visit: Payer: Self-pay | Admitting: *Deleted

## 2023-02-22 VITALS — BP 107/66 | HR 90 | Resp 12 | Ht 64.0 in | Wt 139.0 lb

## 2023-02-22 DIAGNOSIS — E7800 Pure hypercholesterolemia, unspecified: Secondary | ICD-10-CM

## 2023-02-22 DIAGNOSIS — I7300 Raynaud's syndrome without gangrene: Secondary | ICD-10-CM | POA: Diagnosis not present

## 2023-02-22 DIAGNOSIS — Z8269 Family history of other diseases of the musculoskeletal system and connective tissue: Secondary | ICD-10-CM

## 2023-02-22 DIAGNOSIS — M3500 Sicca syndrome, unspecified: Secondary | ICD-10-CM

## 2023-02-22 DIAGNOSIS — Z3A01 Less than 8 weeks gestation of pregnancy: Secondary | ICD-10-CM

## 2023-02-22 DIAGNOSIS — M797 Fibromyalgia: Secondary | ICD-10-CM

## 2023-02-22 DIAGNOSIS — M3310 Other dermatopolymyositis, organ involvement unspecified: Secondary | ICD-10-CM

## 2023-02-22 DIAGNOSIS — Z79899 Other long term (current) drug therapy: Secondary | ICD-10-CM | POA: Diagnosis not present

## 2023-02-22 DIAGNOSIS — Z832 Family history of diseases of the blood and blood-forming organs and certain disorders involving the immune mechanism: Secondary | ICD-10-CM

## 2023-02-22 DIAGNOSIS — R21 Rash and other nonspecific skin eruption: Secondary | ICD-10-CM

## 2023-02-22 DIAGNOSIS — R5383 Other fatigue: Secondary | ICD-10-CM

## 2023-02-22 NOTE — Patient Instructions (Signed)
Standing Labs We placed an order today for your standing lab work.   Please have your standing labs drawn in November and every 3 months  Please have your labs drawn 2 weeks prior to your appointment so that the provider can discuss your lab results at your appointment, if possible.  Please note that you may see your imaging and lab results in MyChart before we have reviewed them. We will contact you once all results are reviewed. Please allow our office up to 72 hours to thoroughly review all of the results before contacting the office for clarification of your results.  WALK-IN LAB HOURS  Monday through Thursday from 8:00 am -12:30 pm and 1:00 pm-5:00 pm and Friday from 8:00 am-12:00 pm.  Patients with office visits requiring labs will be seen before walk-in labs.  You may encounter longer than normal wait times. Please allow additional time. Wait times may be shorter on  Monday and Thursday afternoons.  We do not book appointments for walk-in labs. We appreciate your patience and understanding with our staff.   Labs are drawn by [facility]. Please bring your co-pay at the time of your lab draw.  You may receive a bill from [facility] for your lab work.  Please note if you are on Hydroxychloroquine and and an order has been placed for a Hydroxychloroquine level,  you will need to have it drawn 4 hours or more after your last dose.  If you wish to have your labs drawn at another location, please call the office 24 hours in advance so we can fax the orders.  The office is located at [address], [place], [place] [address]   If you have any questions regarding directions or hours of operation,  please call [phone].   As a reminder, please drink plenty of water prior to coming for your lab work. Thanks!   Vaccines You are taking a medication(s) that can suppress your immune system.  The following immunizations are recommended: Flu annually Covid-19  RSV Td/Tdap (tetanus,  diphtheria, pertussis) every 10 years Pneumonia (Prevnar 15 then Pneumovax 23 at least 1 year apart.  Alternatively, can take Prevnar 20 without needing additional dose) Shingrix: 2 doses from 4 weeks to 6 months apart  Please check with your PCP to make sure you are up to date.   Heart Disease Prevention   Your inflammatory disease increases your risk of heart disease which includes heart attack, stroke, atrial fibrillation (irregular heartbeats), high blood pressure, heart failure and atherosclerosis (plaque in the arteries).  It is important to reduce your risk by:   Keep blood pressure, cholesterol, and blood sugar at healthy levels   Smoking Cessation   Maintain a healthy weight  BMI 20-25   Eat a healthy diet  Plenty of fresh fruit, vegetables, and whole grains  Limit saturated fats, foods high in sodium, and added sugars  DASH and Mediterranean diet   Increase physical activity  Recommend moderate physically activity for 150 minutes per week/ 30 minutes a day for five days a week These can be broken up into three separate ten-minute sessions during the day.   Reduce Stress  Meditation, slow breathing exercises, yoga, coloring books  Dental visits twice a year

## 2023-02-24 LAB — PROTEIN / CREATININE RATIO, URINE
Creatinine, Urine: 77.2 mg/dL
Protein, Ur: 6.2 mg/dL
Protein/Creat Ratio: 80 mg/g creat (ref 0–200)

## 2023-02-24 LAB — SJOGRENS SYNDROME-A EXTRACTABLE NUCLEAR ANTIBODY: ENA SSA (RO) Ab: 0.3 AI (ref 0.0–0.9)

## 2023-02-24 LAB — MYOMARKER 3 PLUS PROFILE (RDL)
Anti-EJ Ab (RDL): NEGATIVE
Anti-Jo-1 Ab (RDL): 46 Units — ABNORMAL HIGH (ref <20)
Anti-Ku Ab (RDL): NEGATIVE
Anti-MDA-5 Ab (CADM-140)(RDL): <20 Units (ref <20)
Anti-Mi-2 Ab (RDL): NEGATIVE
Anti-NXP-2 (P140) Ab (RDL): <20 Units (ref <20)
Anti-OJ Ab (RDL): NEGATIVE
Anti-PL-12 Ab (RDL: NEGATIVE
Anti-PL-7 Ab (RDL): NEGATIVE
Anti-PM/Scl-100 Ab (RDL): <20 Units (ref <20)
Anti-SAE1 Ab, IgG (RDL): <20 Units (ref <20)
Anti-SRP Ab (RDL): NEGATIVE
Anti-SS-A 52kD Ab, IgG (RDL): 103 Units — ABNORMAL HIGH (ref <20)
Anti-TIF-1gamma Ab (RDL): <20 Units (ref <20)
Anti-U1 RNP Ab (RDL): <20 Units (ref <20)
Anti-U2 RNP Ab (RDL): NEGATIVE
Anti-U3 RNP (Fibrillarin)(RDL): NEGATIVE

## 2023-02-24 LAB — CMP14+EGFR
ALT: 16 IU/L (ref 0–32)
AST: 14 IU/L (ref 0–40)
Albumin: 4.3 g/dL (ref 3.9–4.9)
Alkaline Phosphatase: 57 IU/L (ref 44–121)
BUN/Creatinine Ratio: 10 (ref 9–23)
BUN: 10 mg/dL (ref 6–20)
Bilirubin Total: 0.3 mg/dL (ref 0.0–1.2)
CO2: 24 mmol/L (ref 20–29)
Calcium: 9.4 mg/dL (ref 8.7–10.2)
Chloride: 101 mmol/L (ref 96–106)
Creatinine, Ser: 1 mg/dL (ref 0.57–1.00)
Globulin, Total: 2.7 g/dL (ref 1.5–4.5)
Glucose: 97 mg/dL (ref 70–99)
Potassium: 4.1 mmol/L (ref 3.5–5.2)
Sodium: 141 mmol/L (ref 134–144)
Total Protein: 7 g/dL (ref 6.0–8.5)
eGFR: 75 mL/min/{1.73_m2} (ref >59)

## 2023-02-24 LAB — LUPUS ANTICOAGULANT PANEL
Dilute Viper Venom Time: 36.3 s (ref 0.0–47.0)
PTT Lupus Anticoagulant: 37.6 s (ref 0.0–43.5)

## 2023-02-24 LAB — SJOGRENS SYNDROME-B EXTRACTABLE NUCLEAR ANTIBODY: ENA SSB (LA) Ab: <0.2 AI (ref 0.0–0.9)

## 2023-02-24 LAB — SEDIMENTATION RATE

## 2023-02-24 LAB — C3 AND C4
Complement C3, Serum: 130 mg/dL (ref 82–167)
Complement C4, Serum: 32 mg/dL (ref 12–38)

## 2023-02-24 LAB — LIPID PANEL
Chol/HDL Ratio: 3.3 ratio (ref 0.0–4.4)
Cholesterol, Total: 177 mg/dL (ref 100–199)
HDL: 53 mg/dL (ref >39)
LDL Chol Calc (NIH): 91 mg/dL (ref 0–99)
Triglycerides: 196 mg/dL — ABNORMAL HIGH (ref 0–149)
VLDL Cholesterol Cal: 33 mg/dL (ref 5–40)

## 2023-02-24 LAB — CK: Total CK: 227 U/L — ABNORMAL HIGH (ref 32–182)

## 2023-02-24 LAB — ANTI-DNA ANTIBODY, DOUBLE-STRANDED: dsDNA Ab: <1 IU/mL (ref 0–9)

## 2023-02-24 LAB — ANTI-SMITH ANTIBODY: ENA SM Ab Ser-aCnc: <0.2 AI (ref 0.0–0.9)

## 2023-02-24 LAB — CARDIOLIPIN ANTIBODIES, IGG, IGM, IGA
Anticardiolipin IgA: <9 APL U/mL (ref 0–11)
Anticardiolipin IgG: <9 GPL U/mL (ref 0–14)
Anticardiolipin IgM: <9 MPL U/mL (ref 0–12)

## 2023-02-24 LAB — RNP ANTIBODIES: ENA RNP Ab: <0.2 AI (ref 0.0–0.9)

## 2023-02-24 LAB — BETA-2-GLYCOPROTEIN I ABS, IGG/M/A
Beta-2 Glyco 1 IgA: <9 GPI IgA units (ref 0–25)
Beta-2 Glyco 1 IgM: <9 GPI IgM units (ref 0–32)
Beta-2 Glyco I IgG: <9 GPI IgG units (ref 0–20)

## 2023-02-24 LAB — TSH: TSH: 0.453 u[IU]/mL (ref 0.450–4.500)

## 2023-02-24 LAB — ANTINUCLEAR ANTIBODIES, IFA: ANA Titer 1: NEGATIVE

## 2023-02-24 LAB — RHEUMATOID FACTOR: Rheumatoid fact SerPl-aCnc: 10.3 IU/mL (ref <14.0)

## 2023-02-24 LAB — ANTI-SCLERODERMA ANTIBODY: Scleroderma (Scl-70) (ENA) Antibody, IgG: <0.2 AI (ref 0.0–0.9)

## 2023-02-24 LAB — CBC WITH DIFFERENTIAL/PLATELET

## 2023-02-24 LAB — ALDOLASE: Aldolase: 4.5 U/L (ref 3.3–10.3)

## 2023-02-24 LAB — ANTI-HMGCR AB (RDL): Anti-HMGCR Ab (RDL)^: <20 U (ref <20)

## 2023-03-09 ENCOUNTER — Ambulatory Visit: Payer: No Typology Code available for payment source | Admitting: Cardiology

## 2023-04-11 ENCOUNTER — Ambulatory Visit: Payer: No Typology Code available for payment source | Admitting: Cardiology

## 2023-04-12 HISTORY — PX: DILATION AND CURETTAGE OF UTERUS: SHX78

## 2023-05-10 NOTE — Progress Notes (Signed)
Office Visit Note  Patient: Elizabeth Jensen             Date of Birth: 16-Nov-1985           MRN: [id number]             PCP: System, Provider Not In Referring: No ref. provider found Visit Date: 05/24/2023 Occupation: @GUAROCC @  Subjective:  Medication monitoring   History of Present Illness: Saije Socarras is a 37 y.o. female with history of dermatomyositis.  Patient remains on Plaquenil 200 mg twice daily since 2022.  She continues to tolerate Plaquenil without any side effects and has not had any recent gaps in therapy.  Patient denies any signs or symptoms of a dermatomyositis flare.  She has not been experiencing any muscle weakness.  She denies any joint pain or joint swelling at this time.  She experiences ongoing photosensitivity and intermittent rashes on her forearms.  She has been using clobetasol cream topically as needed which has been helpful.  Patient states that since her last office visit she has had a miscarriage-early November 2024.  IUD placed on day of D&C.  She states that she remains under a lot of stress due to working as well as currently going to school.  Patient states that she has been trying to manage her stress levels to prevent a flare.   She has not yet established care with pulmonology or dermatology. She denies any SOB or new pulmonary symptoms.    Activities of Daily Living:  Patient reports morning stiffness for 0 minutes.   Patient Denies nocturnal pain.  Difficulty dressing/grooming: Denies Difficulty climbing stairs: Denies Difficulty getting out of chair: Denies Difficulty using hands for taps, buttons, cutlery, and/or writing: Denies  Review of Systems  Constitutional:  Negative for fatigue.  HENT:  Negative for mouth sores and mouth dryness.   Eyes:  Positive for dryness.  Respiratory:  Negative for shortness of breath.   Cardiovascular:  Negative for chest pain and palpitations.  Gastrointestinal:  Negative for blood in stool, constipation and  diarrhea.  Endocrine: Negative for increased urination.  Genitourinary:  Negative for involuntary urination.  Musculoskeletal:  Negative for joint pain, gait problem, joint pain, joint swelling, myalgias, muscle weakness, morning stiffness, muscle tenderness and myalgias.  Skin:  Positive for rash. Negative for color change, hair loss and sensitivity to sunlight.  Allergic/Immunologic: Negative for susceptible to infections.  Neurological:  Negative for dizziness and headaches.  Hematological:  Negative for swollen glands.  Psychiatric/Behavioral:  Positive for depressed mood. Negative for sleep disturbance. The patient is not nervous/anxious.     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   Abnormal finding on Pap smear, HPV DNA positive 05/10/2012   Dermatomyositis (HCC) 08/31/2011    Past Medical History:  Diagnosis Date   Asthma    NO PROBLEMS NOW   Inflammatory arthritis    Myositis    Rheumatoid aortitis     Family History  Problem Relation Age of Onset   Diabetes Mother    Healthy Father    Healthy Sister    Healthy Sister    Sarcoidosis Maternal Aunt    Other Maternal Grandmother        lupus nephritis   Diabetes Other    Past Surgical History:  Procedure Laterality Date   DILATION AND  CURETTAGE OF UTERUS  04/12/2023   EYE SURGERY Left    MUSCLE BIOPSY  09/02/2011   Procedure: MUSCLE BIOPSY;  Surgeon: Velora Heckler, MD;  Location: [facility];  Service: General;  Laterality: Left;  Quadriceps muscle biospy   Social History   Social History Narrative   Single. Education: [employer]. Exercise: Walk   Immunization History  Administered Date(s) Administered   Influenza Split 03/31/2012     Objective: Vital Signs: BP 128/78 (BP Location: Left Arm)   Pulse 85   Resp 12   Ht 5\' 4"   (1.626 m)   Wt 146 lb (66.2 kg)   Breastfeeding Unknown   BMI 25.06 kg/m    Physical Exam Vitals and nursing note reviewed.  Constitutional:      Appearance: She is well-developed.  HENT:     Head: Normocephalic and atraumatic.  Eyes:     Conjunctiva/sclera: Conjunctivae normal.  Cardiovascular:     Rate and Rhythm: Normal rate and regular rhythm.     Heart sounds: Normal heart sounds.  Pulmonary:     Effort: Pulmonary effort is normal.     Breath sounds: Normal breath sounds.  Abdominal:     General: Bowel sounds are normal.     Palpations: Abdomen is soft.  Musculoskeletal:     Cervical back: Normal range of motion.  Skin:    General: Skin is warm and dry.     Capillary Refill: Capillary refill takes less than 2 seconds.     Comments: Hyperpigmentation noted on the dorsal aspect of the right forearm.  Neurological:     Mental Status: She is alert and oriented to person, place, and time.  Psychiatric:        Behavior: Behavior normal.      Musculoskeletal Exam: C-spine, thoracic spine, lumbar spine good range of motion.  Shoulder joints, elbow joints, wrist joints, MCPs, PIPs, DIPs have good range of motion with no synovitis.  Complete fist formation bilaterally.  Hip joints have good range of motion with no groin pain.  Knee joints have good range of motion no warmth or effusion.  Ankle joints have good range of motion with no tenderness or joint swelling.  Full strength of upper and lower extremities noted.  CDAI Exam: CDAI Score: -- Patient Global: --; Provider Global: -- Swollen: --; Tender: -- Joint Exam 05/24/2023   No joint exam has been documented for this visit   There is currently no information documented on the homunculus. Go to the Rheumatology activity and complete the homunculus joint exam.  Investigation: No additional findings.  Imaging: No results found.  Recent Labs: Lab Results  Component Value Date   WBC CANCELED 02/03/2023   HGB 11.7  (L) 10/03/2014   PLT 272 10/03/2014   NA 141 02/03/2023   K 4.1 02/03/2023   CL 101 02/03/2023   CO2 24 02/03/2023   GLUCOSE 97 02/03/2023   BUN 10 02/03/2023   CREATININE 1.00 02/03/2023   BILITOT 0.3 02/03/2023   ALKPHOS 57 02/03/2023   AST 14 02/03/2023   ALT 16 02/03/2023   PROT 7.0 02/03/2023   ALBUMIN 4.3 02/03/2023   CALCIUM 9.4 02/03/2023   GFRAA >90 10/03/2014    Speciality Comments: PLQ Eye Exam: 03/09/2023 WNL @ [facility] Follow up in 1 year   Procedures:  No procedures performed Allergies: Codeine, Hydrocodone, Imuran [azathioprine], Cellcept [mycophenolate], Methotrexate, Penicillins, and Tramadol    Assessment / Plan:     Visit Diagnoses:  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  Presented initially with weight loss, dysphagia and severe muscle weakness: She has not been experiencing any signs or symptoms of a dermatomyositis flare.  She has clinically been doing well taking Plaquenil 200 mg 1 tablet by mouth twice daily.  She continues to tolerate Plaquenil without any side effects and has not had any recent gaps in therapy.  Patient continues to have ongoing photosensitivity and has had an intermittent rash on her right forearm.  She has been using clobetasol ointment topically as needed.  Hyperpigmentation was noted on examination today.  Discussed the importance of wearing sunscreen on a daily basis. She has not been experiencing any muscular weakness.  Full strength of upper and lower extremities noted today.  No synovitis noted on exam.  No dysphagia.  No new or worsening pulmonary symptoms. A referral to pulmonology, ophthalmology, and cardiology were placed on 01/26/23.  She has established care at [facility] eye care and had a Plaquenil eye examination on 03/09/2023. She has not yet scheduled a visit with cardiology or pulmonology.  Encourage patient to schedule appointment with pulmonology for  updated PFTs.   Derm referral placed 02/22/23-- upcoming appointment with Dr. Reche Dixon on 06/21/23.  She will remain on Plaquenil as prescribed.  She was advised to notify us if she develops any signs or symptoms of a flare.  She will follow-up in the office in 3 months or sooner if needed.  - Plan: CK  High risk medication use - Plaquenil 200 mg twice daily since 2022.  PreMTX-GI SEs, Enbrel-adequate response, CellCept 2016-2017-DC'd due to high cholesterol.  PLQ Eye Exam: 03/09/2023 WNL @ [facility] Follow up in 1 year  Orders for CBC and CMP released today.  She will continue to require updated lab work every 3 months.  Standing orders for CBC and CMP remain in place. IUD in place.  - Plan: COMPLETE METABOLIC PANEL WITH GFR, CBC with Differential/Platelet  Raynaud's syndrome without gangrene - Since 2020.  She is symptomatic only during winter months.  No signs of sclerodactyly at this time.  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 Chronic dry eyes.    Rash: Hyperpigmentation noted on right forearm. Ongoing photosensitivity.  She is avoiding direct sun exposure and using sunscreen daily.  Using clobetasol ointment as needed.  Upcoming appointment with Dr. Reche Dixon on 06/21/23.    High cholesterol: Total cholesterol was 177, triglycerides 196, HDL 53, LDL 91 on 02/03/2023.  Dietary changes were discussed by her PCP on 04/29/2023.  She has started to take red yeast rice as recommended by PCP.  PCP recommends planning to update lipid panel in 6 months.  Other fatigue: Patient is not experiencing any fatigue at this time.    Fibromyalgia: Patient has been trying to manage her stress levels which has been helpful at preventing fibromyalgia flares.  History of miscarriage:Early November 2024-required D&C. IUD placed.   Other medical conditions are listed as follows:   Family history of lupus nephritis-maternal grandmother  Family history of sarcoidosis-maternal  aunt  Orders: Orders Placed This Encounter  Procedures   COMPLETE METABOLIC PANEL WITH GFR   CBC with Differential/Platelet   CK   No orders of the defined types were placed in this encounter.  Follow-Up Instructions: Return in about 3 months (around 08/22/2023) for Dermatomyositis.   Gearldine Bienenstock, PA-C  Note - This record has been created using Dragon  software.  Chart creation errors have been sought, but may not always  have been located. Such creation errors do not reflect on  the standard of medical care.

## 2023-05-24 ENCOUNTER — Encounter: Payer: Self-pay | Admitting: Physician Assistant

## 2023-05-24 ENCOUNTER — Ambulatory Visit: Payer: No Typology Code available for payment source | Attending: Physician Assistant | Admitting: Physician Assistant

## 2023-05-24 VITALS — BP 128/78 | HR 85 | Resp 12 | Ht 64.0 in | Wt 146.0 lb

## 2023-05-24 DIAGNOSIS — Z79899 Other long term (current) drug therapy: Secondary | ICD-10-CM | POA: Diagnosis not present

## 2023-05-24 DIAGNOSIS — Z8759 Personal history of other complications of pregnancy, childbirth and the puerperium: Secondary | ICD-10-CM

## 2023-05-24 DIAGNOSIS — R21 Rash and other nonspecific skin eruption: Secondary | ICD-10-CM

## 2023-05-24 DIAGNOSIS — M3310 Other dermatopolymyositis, organ involvement unspecified: Secondary | ICD-10-CM

## 2023-05-24 DIAGNOSIS — E7800 Pure hypercholesterolemia, unspecified: Secondary | ICD-10-CM

## 2023-05-24 DIAGNOSIS — Z832 Family history of diseases of the blood and blood-forming organs and certain disorders involving the immune mechanism: Secondary | ICD-10-CM

## 2023-05-24 DIAGNOSIS — R5383 Other fatigue: Secondary | ICD-10-CM

## 2023-05-24 DIAGNOSIS — M3500 Sicca syndrome, unspecified: Secondary | ICD-10-CM | POA: Diagnosis not present

## 2023-05-24 DIAGNOSIS — Z8269 Family history of other diseases of the musculoskeletal system and connective tissue: Secondary | ICD-10-CM

## 2023-05-24 DIAGNOSIS — M797 Fibromyalgia: Secondary | ICD-10-CM

## 2023-05-24 DIAGNOSIS — I7300 Raynaud's syndrome without gangrene: Secondary | ICD-10-CM

## 2023-05-24 NOTE — Patient Instructions (Signed)
 Standing Labs We placed an order today for your standing lab work.   Please have your standing labs drawn in march and every 3 months   Please have your labs drawn 2 weeks prior to your appointment so that the provider can discuss your lab results at your appointment, if possible.  Please note that you may see your imaging and lab results in MyChart before we have reviewed them. We will contact you once all results are reviewed. Please allow our office up to 72 hours to thoroughly review all of the results before contacting the office for clarification of your results.  WALK-IN LAB HOURS  Monday through Thursday from 8:00 am -12:30 pm and 1:00 pm-5:00 pm and Friday from 8:00 am-12:00 pm.  Patients with office visits requiring labs will be seen before walk-in labs.  You may encounter longer than normal wait times. Please allow additional time. Wait times may be shorter on  Monday and Thursday afternoons.  We do not book appointments for walk-in labs. We appreciate your patience and understanding with our staff.   Labs are drawn by [facility]. Please bring your co-pay at the time of your lab draw.  You may receive a bill from [facility] for your lab work.  Please note if you are on Hydroxychloroquine and and an order has been placed for a Hydroxychloroquine level,  you will need to have it drawn 4 hours or more after your last dose.  If you wish to have your labs drawn at another location, please call the office 24 hours in advance so we can fax the orders.  The office is located at [address], [address], [place], [place] [address]   If you have any questions regarding directions or hours of operation,  please call [phone].   As a reminder, please drink plenty of water prior to coming for your lab work. Thanks!

## 2023-05-25 LAB — COMPLETE METABOLIC PANEL WITH GFR
AG Ratio: 1.5 (calc) (ref 1.0–2.5)
ALT: 18 U/L (ref 6–29)
AST: 14 U/L (ref 10–30)
Albumin: 4.5 g/dL (ref 3.6–5.1)
Alkaline phosphatase (APISO): 49 U/L (ref 31–125)
BUN: 9 mg/dL (ref 7–25)
CO2: 23 mmol/L (ref 20–32)
Calcium: 9.5 mg/dL (ref 8.6–10.2)
Chloride: 104 mmol/L (ref 98–110)
Creat: 0.95 mg/dL (ref 0.50–0.97)
Globulin: 3.1 g/dL (ref 1.9–3.7)
Glucose, Bld: 86 mg/dL (ref 65–99)
Potassium: 4 mmol/L (ref 3.5–5.3)
Sodium: 138 mmol/L (ref 135–146)
Total Bilirubin: 0.3 mg/dL (ref 0.2–1.2)
Total Protein: 7.6 g/dL (ref 6.1–8.1)
eGFR: 80 mL/min/{1.73_m2} (ref >=60)

## 2023-05-25 LAB — CBC WITH DIFFERENTIAL/PLATELET
Absolute Lymphocytes: 1789 {cells}/uL (ref 850–3900)
Absolute Monocytes: 460 {cells}/uL (ref 200–950)
Basophils Absolute: 51 {cells}/uL (ref 0–200)
Basophils Relative: 0.7 %
Eosinophils Absolute: 80 {cells}/uL (ref 15–500)
Eosinophils Relative: 1.1 %
HCT: 35.7 % (ref 35.0–45.0)
Hemoglobin: 12.3 g/dL (ref 11.7–15.5)
MCH: 29.6 pg (ref 27.0–33.0)
MCHC: 34.5 g/dL (ref 32.0–36.0)
MCV: 85.8 fL (ref 80.0–100.0)
MPV: 8.6 fL (ref 7.5–12.5)
Monocytes Relative: 6.3 %
Neutro Abs: 4920 {cells}/uL (ref 1500–7800)
Neutrophils Relative %: 67.4 %
Platelets: 426 10*3/uL — ABNORMAL HIGH (ref 140–400)
RBC: 4.16 10*6/uL (ref 3.80–5.10)
RDW: 12.6 % (ref 11.0–15.0)
Total Lymphocyte: 24.5 %
WBC: 7.3 10*3/uL (ref 3.8–10.8)

## 2023-05-25 LAB — CK: Total CK: 246 U/L — ABNORMAL HIGH (ref 29–143)

## 2023-05-25 NOTE — Progress Notes (Signed)
CMP WNL CK is borderline elevated but stable. Plan to recheck CK in 3 months.   Platelet count is borderline elevated-426K. Rest of CBC WNL.  We will continue to monitor.

## 2023-07-28 ENCOUNTER — Institutional Professional Consult (permissible substitution): Payer: No Typology Code available for payment source | Admitting: Internal Medicine

## 2023-08-08 NOTE — Progress Notes (Signed)
 Office Visit Note  Patient: Elizabeth Jensen             Date of Birth: 1985/12/07           MRN: [id number]             PCP: Filomena Jungling, NP Referring: No ref. provider found Visit Date: 08/22/2023 Occupation: @GUAROCC @  Subjective:  Medication monitoring  History of Present Illness: Elizabeth Jensen is a 38 y.o. female with history of dermatomyositis. Patient remains on Plaquenil 200 mg 1 tablet by mouth twice daily.  She is tolerating Plaquenil without any side effects.  She requested a refill of Plaquenil sent to the pharmacy today.  She denies any signs or symptoms of a myositis flare.  She has not had any increased muscular weakness or myalgias.  Patient states she has not yet established care with dermatology but states that she has been using a clobetasol ointment as needed for rashes on her chest.  Patient requested a refill of clobetasol ointment today.  She denies any oral or nasal ulcerations.  She continues to have chronic dry eyes and has been using refresh eyedrops as needed.  She denies any symptoms of Raynaud's phenomenon.  Her energy level has been stable.  She continues to work at [employer]ology.  She denies any increased joint pain or joint swelling.  Activities of Daily Living:  Patient reports morning stiffness for 0 minute.   Patient Denies nocturnal pain.  Difficulty dressing/grooming: Denies Difficulty climbing stairs: Denies Difficulty getting out of chair: Denies Difficulty using hands for taps, buttons, cutlery, and/or writing: Denies  Review of Systems  Constitutional:  Negative for fatigue.  HENT:  Negative for mouth sores and mouth dryness.   Eyes:  Positive for dryness.  Respiratory:  Negative for shortness of breath.   Cardiovascular:  Negative for chest pain and palpitations.  Gastrointestinal:  Negative for blood in stool, constipation and diarrhea.  Endocrine: Negative for increased urination.  Genitourinary:  Negative for involuntary urination.   Musculoskeletal:  Negative for joint pain, gait problem, joint pain, joint swelling, myalgias, muscle weakness, morning stiffness, muscle tenderness and myalgias.  Skin:  Positive for rash and sensitivity to sunlight. Negative for color change and hair loss.  Allergic/Immunologic: Negative for susceptible to infections.  Neurological:  Negative for dizziness and headaches.  Hematological:  Negative for swollen glands.  Psychiatric/Behavioral:  Negative for depressed mood and sleep disturbance. The patient is not nervous/anxious.     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   Abnormal finding on Pap smear, HPV DNA positive 05/10/2012   Dermatomyositis (HCC) 08/31/2011    Past Medical History:  Diagnosis Date   Asthma    NO PROBLEMS NOW   Inflammatory arthritis    Myositis    Rheumatoid aortitis     Family History  Problem Relation Age of Onset   Diabetes Mother    Healthy Father    Healthy Sister    Healthy Sister    Sarcoidosis Maternal Aunt    Other Maternal Grandmother        lupus nephritis   Diabetes Other    Past Surgical History:  Procedure Laterality Date   DILATION AND CURETTAGE OF UTERUS  04/12/2023   EYE SURGERY Left    MUSCLE BIOPSY  09/02/2011   Procedure: MUSCLE BIOPSY;  Surgeon: Velora Heckler, MD;  Location: [facility];  Service: General;  Laterality: Left;  Quadriceps muscle biospy   Social History   Social History Narrative   Single. Education: [employer]. Exercise: Walk   Immunization History  Administered Date(s) Administered   Influenza Split 03/31/2012     Objective: Vital Signs: BP 117/81 (BP Location: Left Arm, Patient Position: Sitting, Cuff Size: Normal)   Pulse 76   Resp 14   Ht 5\' 4"  (1.626 m)   Wt 137 lb (62.1 kg)   Breastfeeding No   BMI 23.52 kg/m     Physical Exam Vitals and nursing note reviewed.  Constitutional:      Appearance: She is well-developed.  HENT:     Head: Normocephalic and atraumatic.  Eyes:     Conjunctiva/sclera: Conjunctivae normal.  Cardiovascular:     Rate and Rhythm: Normal rate and regular rhythm.     Heart sounds: Normal heart sounds.  Pulmonary:     Effort: Pulmonary effort is normal.     Breath sounds: Normal breath sounds.  Abdominal:     General: Bowel sounds are normal.     Palpations: Abdomen is soft.  Musculoskeletal:     Cervical back: Normal range of motion.  Lymphadenopathy:     Cervical: No cervical adenopathy.  Skin:    General: Skin is warm and dry.     Capillary Refill: Capillary refill takes less than 2 seconds.  Neurological:     Mental Status: She is alert and oriented to person, place, and time.  Psychiatric:        Behavior: Behavior normal.      Musculoskeletal Exam: C-spine, thoracic spine, lumbar spine and good range of motion.  Shoulder joints, elbow joints, wrist joints, MCPs and PIPs, DIPs have good range of motion with no synovitis.  Complete fist formation bilaterally.  Hip joints have good range of motion with no groin pain.  Knee joints have good range of motion with no warmth or effusion.  Ankle joints have good range of motion with no tenderness or joint swelling.  CDAI Exam: CDAI Score: -- Patient Global: --; Provider Global: -- Swollen: --; Tender: -- Joint Exam 08/22/2023   No joint exam has been documented for this visit   There is currently no information documented on the homunculus. Go to the Rheumatology activity and complete the homunculus joint exam.  Investigation: No additional findings.  Imaging: No results found.  Recent Labs: Lab Results  Component Value Date   WBC 7.3 05/24/2023   HGB 12.3 05/24/2023   PLT 426 (H) 05/24/2023   NA 138 05/24/2023   K 4.0 05/24/2023   CL 104 05/24/2023   CO2 23 05/24/2023   GLUCOSE 86 05/24/2023    BUN 9 05/24/2023   CREATININE 0.95 05/24/2023   BILITOT 0.3 05/24/2023   ALKPHOS 57 02/03/2023   AST 14 05/24/2023   ALT 18 05/24/2023   PROT 7.6 05/24/2023   ALBUMIN 4.3 02/03/2023   CALCIUM 9.5 05/24/2023   GFRAA >90 10/03/2014    Speciality Comments: PLQ Eye Exam: 03/09/2023 WNL @ [facility] Follow up in 1 year   Procedures:  No procedures performed Allergies: Codeine, Hydrocodone, Imuran [azathioprine], Cellcept [mycophenolate], Methotrexate, Penicillins, and Tramadol      Assessment / Plan:     Visit Diagnoses: 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  Presented initially with weight loss, dysphagia and  severe muscle weakness: She is clinically doing doing well taking Plaquenil 200 mg 1 tablet by mouth twice daily.  She has been tolerating Plaquenil without any side effects and has not had any recent gaps in therapy.  She has not had any signs of myositis.  No muscular weakness noted on examination today.  Her energy level has been stable.  CK updated 05/24/23--246.  Plan to check CK today. She had no synovitis on examination today. Patient has not had a follow-up visit with dermatology--she has been using clobetasol prop-niacinamide ointment PRN--A refill was sent to the pharmacy.  Discussed the importance of sun protection including wearing sun protective clothing and SPF 30 on a daily basis. She has not yet established care with cardiology or pulmonology--she is aware that she will require evaluation for screening of pulmonary hypertension and ILD. Discussed the importance of remaining up-to-date with age-related cancer screenings.  According to the patient she recently had a pelvic exam and Pap smear performed. Patient remain on Plaquenil as prescribed.  She was advised to notify us if she develops any new or worsening symptoms.  She will follow-up in the office in 4 months or sooner if needed. - Plan: CBC  with Differential/Platelet, COMPLETE METABOLIC PANEL WITH GFR, CK  High risk medication use - Plaquenil 200 mg 1 tablet by mouth twice daily since 2022.   Previous therapy: MTX-GI SEs, Enbrel-inadequate response, CellCept 2016-2017-DC'd due high cholesterol.  PLQ Eye Exam: 03/09/2023 WNL @ [facility] Follow up in 1 year.  CBC and CMP updated on 05/24/23.  Order for CBC and CMP released today. No recent or recurrent infections.  - Plan: CBC with Differential/Platelet, COMPLETE METABOLIC PANEL WITH GFR  Raynaud's syndrome without gangrene - Since 2020: Not currently symptomatic.  No digital ulcerations.  No signs of sclerodactyly.  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 Chronic dry eyes.  She has been using refresh eyedrops as needed-recommended by [facility].  Mouth dryness has been well-controlled with drinking water throughout the day.  Rash - Dr. Reche Dixon: She has not been recently evaluated by dermatology.  She has occasional rash on her chest, and she uses clobetasol prop-niacinamide ointment PRN--a refill was sent to the pharmacy today.  Discussed the importance of sun protection including wearing sun protective clothing and SPF on a daily basis.   Other fatigue: Stable.   Fibromyalgia: She has not had any recent fibromyalgia flares.  Her energy level has been stable.  Discussed the importance of regular exercise and good sleep hygiene.  Other medical conditions are listed as follows:   Family history of lupus nephritis-maternal grandmother  Family history of sarcoidosis-maternal aunt  History of miscarriage - Early November 2024-required D&C. IUD placed.  High cholesterol: Total cholesterol was 177, triglycerides 196, HDL 53, LDL 91 on 02/03/23.   Orders: Orders Placed This Encounter  Procedures   CBC with Differential/Platelet   COMPLETE METABOLIC PANEL WITH GFR   CK   Meds ordered this encounter  Medications   hydroxychloroquine (PLAQUENIL) 200 MG tablet     Sig: Take 1 tablet (200 mg total) by mouth 2 (two) times daily.    Dispense:  180 tablet    Refill:  0   Clobetasol Prop-Niacinamide 0.05-4 % OINT    Sig: Apply 1 Application topically 2 (two) times daily as needed.    Dispense:  60 g    Refill:  0     Follow-Up Instructions: Return in about 4 months (around 12/22/2023)  for Dermatomyositis.   Gearldine Bienenstock, PA-C  Note - This record has been created using Dragon software.  Chart creation errors have been sought, but may not always  have been located. Such creation errors do not reflect on  the standard of medical care.

## 2023-08-22 ENCOUNTER — Encounter: Payer: Self-pay | Admitting: Physician Assistant

## 2023-08-22 ENCOUNTER — Ambulatory Visit: Payer: No Typology Code available for payment source | Attending: Physician Assistant | Admitting: Physician Assistant

## 2023-08-22 VITALS — BP 117/81 | HR 76 | Resp 14 | Ht 64.0 in | Wt 137.0 lb

## 2023-08-22 DIAGNOSIS — R5383 Other fatigue: Secondary | ICD-10-CM

## 2023-08-22 DIAGNOSIS — R21 Rash and other nonspecific skin eruption: Secondary | ICD-10-CM

## 2023-08-22 DIAGNOSIS — Z8269 Family history of other diseases of the musculoskeletal system and connective tissue: Secondary | ICD-10-CM

## 2023-08-22 DIAGNOSIS — Z832 Family history of diseases of the blood and blood-forming organs and certain disorders involving the immune mechanism: Secondary | ICD-10-CM

## 2023-08-22 DIAGNOSIS — M3310 Other dermatopolymyositis, organ involvement unspecified: Secondary | ICD-10-CM | POA: Diagnosis not present

## 2023-08-22 DIAGNOSIS — Z79899 Other long term (current) drug therapy: Secondary | ICD-10-CM | POA: Diagnosis not present

## 2023-08-22 DIAGNOSIS — I7300 Raynaud's syndrome without gangrene: Secondary | ICD-10-CM

## 2023-08-22 DIAGNOSIS — M3500 Sicca syndrome, unspecified: Secondary | ICD-10-CM | POA: Diagnosis not present

## 2023-08-22 DIAGNOSIS — Z8759 Personal history of other complications of pregnancy, childbirth and the puerperium: Secondary | ICD-10-CM

## 2023-08-22 DIAGNOSIS — M797 Fibromyalgia: Secondary | ICD-10-CM

## 2023-08-22 DIAGNOSIS — E7800 Pure hypercholesterolemia, unspecified: Secondary | ICD-10-CM

## 2023-08-22 MED ORDER — HYDROXYCHLOROQUINE SULFATE 200 MG PO TABS: 200.0000 mg | ORAL_TABLET | Freq: Two times a day (BID) | ORAL | 0 refills | Status: DC

## 2023-08-22 MED ORDER — CLOBETASOL PROP-NIACINAMIDE 0.05-4 % EX OINT: 1.0000 | TOPICAL_OINTMENT | Freq: Two times a day (BID) | CUTANEOUS | 0 refills | Status: AC | PRN

## 2023-08-26 ENCOUNTER — Encounter: Payer: Self-pay | Admitting: Rheumatology

## 2023-08-26 LAB — CBC WITH DIFFERENTIAL/PLATELET
Absolute Lymphocytes: 1835 {cells}/uL (ref 850–3900)
Absolute Monocytes: 450 {cells}/uL (ref 200–950)
Basophils Absolute: 40 {cells}/uL (ref 0–200)
Basophils Relative: 0.7 %
Eosinophils Absolute: 131 {cells}/uL (ref 15–500)
Eosinophils Relative: 2.3 %
HCT: 35.3 % (ref 35.0–45.0)
Hemoglobin: 12.1 g/dL (ref 11.7–15.5)
MCH: 30.1 pg (ref 27.0–33.0)
MCHC: 34.3 g/dL (ref 32.0–36.0)
MCV: 87.8 fL (ref 80.0–100.0)
MPV: 8.3 fL (ref 7.5–12.5)
Monocytes Relative: 7.9 %
Neutro Abs: 3243 {cells}/uL (ref 1500–7800)
Neutrophils Relative %: 56.9 %
Platelets: 341 10*3/uL (ref 140–400)
RBC: 4.02 10*6/uL (ref 3.80–5.10)
RDW: 14.6 % (ref 11.0–15.0)
Total Lymphocyte: 32.2 %
WBC: 5.7 10*3/uL (ref 3.8–10.8)

## 2023-08-26 LAB — COMPREHENSIVE METABOLIC PANEL
AG Ratio: 1.5 (calc) (ref 1.0–2.5)
ALT: 13 U/L (ref 6–29)
AST: 12 U/L (ref 10–30)
Albumin: 4.3 g/dL (ref 3.6–5.1)
Alkaline phosphatase (APISO): 47 U/L (ref 31–125)
BUN: 11 mg/dL (ref 7–25)
CO2: 24 mmol/L (ref 20–32)
Calcium: 9.3 mg/dL (ref 8.6–10.2)
Chloride: 103 mmol/L (ref 98–110)
Creat: 0.91 mg/dL (ref 0.50–0.97)
Globulin: 2.9 g/dL (ref 1.9–3.7)
Glucose, Bld: 99 mg/dL (ref 65–99)
Potassium: 4.1 mmol/L (ref 3.5–5.3)
Sodium: 137 mmol/L (ref 135–146)
Total Bilirubin: 0.4 mg/dL (ref 0.2–1.2)
Total Protein: 7.2 g/dL (ref 6.1–8.1)
eGFR: 84 mL/min/{1.73_m2} (ref >=60)

## 2023-08-26 LAB — CK: Total CK: 193 U/L (ref 20–239)

## 2023-08-26 NOTE — Progress Notes (Signed)
CBC and CMP WNL.  CK WNL.

## 2023-08-26 NOTE — Telephone Encounter (Signed)
 I called patient and pharmacy, verbal given to pharmacy for clobetasol ointment.

## 2023-08-26 NOTE — Telephone Encounter (Signed)
 I called patient, RX sent on 08/22/2023, I will call pharmacy at 9:00 am to confirm receipt of RX.

## 2023-10-04 ENCOUNTER — Encounter: Payer: Self-pay | Admitting: Rheumatology

## 2023-10-04 NOTE — Telephone Encounter (Signed)
 MMR is a live vaccine but she does not have to stop plaquenil  since it is not an immunosuppressive agent-it is an immunomodulator.

## 2023-11-30 MED ORDER — HYDROXYCHLOROQUINE SULFATE 200 MG PO TABS: 200.0000 mg | ORAL_TABLET | Freq: Two times a day (BID) | ORAL | 0 refills | Status: DC

## 2023-11-30 NOTE — Telephone Encounter (Signed)
 Last Fill: 08/22/2023  Eye exam: 03/09/2023 WNL    Labs: 08/25/2023 CBC and CMP WNL  CK WNL   Next Visit: 12/26/2023  Last Visit: 08/22/2023  DX: Dermatomyositis sine myositis   Current Dose per office note 08/22/2023:  Plaquenil  200 mg 1 tablet by mouth twice daily   Okay to refill Plaquenil ?

## 2023-12-12 NOTE — Progress Notes (Signed)
 Office Visit Note  Patient: Elizabeth Jensen             Date of Birth: [birth year]           MRN: [id number]             PCP: Celestia Harder, NP Referring: Celestia Harder, NP Visit Date: 12/26/2023 Occupation: @GUAROCC @  Subjective:  Rash on upper chest   History of Present Illness: Elizabeth Jensen is a 38 y.o. female with history of dermatomyositis sine myositis. Patient remains on plaquenil  200 mg 1 tablet by mouth twice daily.  She is tolerating Plaquenil  without any side effects and has not had any recent gaps in therapy.  Patient states that she has been experiencing increased pain in both knee joints intermittently.  Patient states that she occasionally will take ibuprofen  800 mg as needed for pain relief.  She has not noticed any new joint swelling.  Patient states that she currently has a rash on her upper chest near her neck which has been there for the past few months.  Patient states that she uses clobetasol  ointment as needed but has not yet seen dermatology.  Patient states that she has not used clobetasol  recently but has a prescription at the pharmacy to pick up.  She has been trying to avoid direct sun exposure.  She denies any other recent rashes.  She denies any increase skin tightness or thickening.  She denies any symptoms of Raynaud's phenomenon.  She continues to have chronic sicca symptoms which have been manageable.     Activities of Daily Living:  Patient reports morning stiffness for  none.   Patient Denies nocturnal pain.  Difficulty dressing/grooming: Denies Difficulty climbing stairs: Denies Difficulty getting out of chair: Denies Difficulty using hands for taps, buttons, cutlery, and/or writing: Denies  Review of Systems  Constitutional:  Positive for fatigue.  HENT:  Positive for mouth dryness. Negative for mouth sores.   Eyes:  Positive for dryness.  Respiratory:  Negative for shortness of breath.   Cardiovascular:  Negative for chest pain and palpitations.   Gastrointestinal:  Negative for blood in stool, constipation and diarrhea.  Endocrine: Negative for increased urination.  Genitourinary:  Negative for involuntary urination.  Musculoskeletal:  Positive for joint pain, joint pain and muscle tenderness. Negative for gait problem, joint swelling, myalgias, muscle weakness, morning stiffness and myalgias.  Skin:  Positive for color change, rash and sensitivity to sunlight. Negative for hair loss.  Allergic/Immunologic: Negative for susceptible to infections.  Neurological:  Positive for headaches. Negative for dizziness.  Hematological:  Negative for swollen glands.  Psychiatric/Behavioral:  Positive for sleep disturbance. Negative for depressed mood. The patient is nervous/anxious.     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   Abnormal finding on Pap smear, HPV DNA positive 05/10/2012   Dermatomyositis (HCC) 08/31/2011    Past Medical History:  Diagnosis Date   Asthma    NO PROBLEMS NOW   Inflammatory arthritis    Myositis    Rheumatoid aortitis     Family History  Problem Relation Age of Onset   Diabetes Mother    Healthy Father    Healthy Sister    Healthy Sister    Sarcoidosis Maternal Aunt    Other Maternal Grandmother        lupus nephritis  Diabetes Other    Past Surgical History:  Procedure Laterality Date   DILATION AND CURETTAGE OF UTERUS  04/12/2023   EYE SURGERY Left    MUSCLE BIOPSY  09/02/2011   Procedure: MUSCLE BIOPSY;  Surgeon: Krystal CHRISTELLA Spinner, MD;  Location: [facility];  Service: General;  Laterality: Left;  Quadriceps muscle biospy   Social History   Social History Narrative   Single. Education: [employer]. Exercise: Walk   Immunization History  Administered Date(s) Administered   Influenza Split 03/31/2012      Objective: Vital Signs: BP 105/70 (BP Location: Left Arm, Patient Position: Sitting, Cuff Size: Normal)   Resp 16   Ht 5' 4 (1.626 m)   Wt 134 lb (60.8 kg)   LMP 12/17/2023   BMI 23.00 kg/m    Physical Exam Vitals and nursing note reviewed.  Constitutional:      Appearance: She is well-developed.  HENT:     Head: Normocephalic and atraumatic.  Eyes:     Conjunctiva/sclera: Conjunctivae normal.  Cardiovascular:     Rate and Rhythm: Normal rate and regular rhythm.     Heart sounds: Normal heart sounds.  Pulmonary:     Effort: Pulmonary effort is normal.     Breath sounds: Normal breath sounds.  Abdominal:     General: Bowel sounds are normal.     Palpations: Abdomen is soft.  Musculoskeletal:     Cervical back: Normal range of motion.  Lymphadenopathy:     Cervical: No cervical adenopathy.  Skin:    General: Skin is warm and dry.     Capillary Refill: Capillary refill takes less than 2 seconds.  Neurological:     Mental Status: She is alert and oriented to person, place, and time.  Psychiatric:        Behavior: Behavior normal.      Musculoskeletal Exam:C-spine, thoracic spine, and lumbar spine good range of motion.  Shoulder joints, elbow joints, wrist joints and MCPs, PIPs, DIPs have good range of motion with no synovitis.  Complete fist formation bilaterally.  Hip joints have good range of motion with no groin pain.  Knee joints have good range of motion no warmth or effusion.  Ankle joints have good range of motion with no tenderness or joint swelling.  CDAI Exam: CDAI Score: -- Patient Global: --; Provider Global: -- Swollen: --; Tender: -- Joint Exam 12/26/2023   No joint exam has been documented for this visit   There is currently no information documented on the homunculus. Go to the Rheumatology activity and complete the homunculus joint exam.  Investigation: No additional findings.  Imaging: No results found.  Recent Labs: Lab Results   Component Value Date   WBC 5.7 08/25/2023   HGB 12.1 08/25/2023   PLT 341 08/25/2023   NA 137 08/25/2023   K 4.1 08/25/2023   CL 103 08/25/2023   CO2 24 08/25/2023   GLUCOSE 99 08/25/2023   BUN 11 08/25/2023   CREATININE 0.91 08/25/2023   BILITOT 0.4 08/25/2023   ALKPHOS 57 02/03/2023   AST 12 08/25/2023   ALT 13 08/25/2023   PROT 7.2 08/25/2023   ALBUMIN 4.3 02/03/2023   CALCIUM 9.3 08/25/2023   GFRAA >90 10/03/2014    Speciality Comments: PLQ Eye Exam: 03/09/2023 WNL @ [facility] Follow up in 1 year   Procedures:  No procedures performed Allergies: Codeine, Hydrocodone , Imuran [azathioprine], Cellcept [mycophenolate], Methotrexate , Penicillins, and Tramadol     Assessment / Plan:  Visit Diagnoses: 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  Presented initially with weight loss, dysphagia and severe muscle weakness: She has not had any signs or symptoms of a flare.  She has clinically been doing well taking Plaquenil  200 mg 1 tablet by mouth twice daily.  She is tolerating Plaquenil  without any side effects and has not had any recent gaps in therapy.  She has not had any signs or symptoms of a myositis flare.  Her CK was within normal limits on 08/25/2023.  Plan to recheck CK today.  She had no synovitis on examination today.  She has noted some increased pain in both knee joints but has not had any active inflammation. Patient has not yet scheduled an appointment with dermatology.  She has an ongoing rash on her upper chest/base of her neck, which has been itching and irritated.  She has a prescription for clobetasol  prop-niacinamide  ointment which she applies as needed but has been out of the prescription recently.  According to the patient she has a refill waiting for her at the pharmacy.  Plan to place a new referral for dermatology within [facility].   Discussed the importance of avoiding direct sun exposure and  to wear sunscreen on a daily basis. She is not experiencing any new or worsening pulmonary symptoms. She has not had any symptoms of Raynaud's phenomenon.  No signs of sclerodactyly noted. She will remain on Plaquenil  as prescribed.  She was advised to notify us  if she develops any new or worsening symptoms. She will follow-up in the office in 3 months or sooner if needed. - Plan: CK, Ambulatory referral to Dermatology  High risk medication use - Plaquenil  200 mg 1 tablet BID- 2022.  Previous tx: MTX-GI SEs, Enbrel-IR, CellCept 2016-2017-DC'd due high cholesterol.  CBC and CMP WNL on 08/25/23.  PLQ Eye Exam: 03/09/2023 WNL @ [facility] Follow up in 1 year  - Plan: CBC with Differential/Platelet, Comprehensive metabolic panel with GFR  Raynaud's syndrome without gangrene: She has not had any symptoms of Raynaud's phenomenon.  No signs of sclerodactyly noted.  No digital ulcerations noted.  Sicca syndrome [facility]): She continues to have chronic sicca symptoms.    Rash - Dr. Jorizzo: She has not been recently evaluated by dermatology.  She currently has a rash on her upper chest. She uses clobetasol  prop-niacinamide  ointment PRN.  A new referral to dermatology was placed today for further evaluation and management since the rash has been inadequately controlled with this topical agent. Discussed the importance of avoiding direct sun exposure and wearing sunscreen on a daily basis.- Plan: Ambulatory referral to Dermatology  Chronic pain of both knees: Patient has been experiencing increased pain in both knee joints.  She has not noticed any joint swelling.  On examination today she has good range of motion of both knee joints with no warmth or effusion.  She was advised to notify us  if her symptoms persist or worsen.  Discussed the importance of lower extremity muscle strengthening.  Other fatigue: Chronic, stable.  Fibromyalgia: She experiences intermittent myalgias and muscle tenderness due to  fibromyalgia.  Overall her energy level has been stable.  Other medical conditions are listed as follows:  Family history of lupus nephritis-maternal grandmother  Family history of sarcoidosis-maternal aunt  History of miscarriage - Early November 2024-required D&C. IUD placed.  High cholesterol  Orders: Orders Placed This Encounter  Procedures   CBC with  Differential/Platelet   Comprehensive metabolic panel with GFR   CK   Ambulatory referral to Dermatology   Meds ordered this encounter  Medications   cyclobenzaprine  (FLEXERIL ) 5 MG tablet    Sig: Take 1 tablet (5 mg total) by mouth at bedtime as needed for muscle spasms.    Dispense:  30 tablet    Refill:  0   Follow-Up Instructions: Return in about 5 months (around 05/27/2024) for Dermatomyositis .   Waddell CHRISTELLA Craze, PA-C  Note - This record has been created using Dragon software.  Chart creation errors have been sought, but may not always  have been located. Such creation errors do not reflect on  the standard of medical care.

## 2023-12-26 ENCOUNTER — Encounter: Payer: Self-pay | Admitting: Physician Assistant

## 2023-12-26 ENCOUNTER — Encounter: Payer: Self-pay | Admitting: *Deleted

## 2023-12-26 ENCOUNTER — Ambulatory Visit: Attending: Physician Assistant | Admitting: Physician Assistant

## 2023-12-26 VITALS — BP 105/70 | Resp 16 | Ht 64.0 in | Wt 134.0 lb

## 2023-12-26 DIAGNOSIS — Z79899 Other long term (current) drug therapy: Secondary | ICD-10-CM

## 2023-12-26 DIAGNOSIS — Z8269 Family history of other diseases of the musculoskeletal system and connective tissue: Secondary | ICD-10-CM

## 2023-12-26 DIAGNOSIS — R5383 Other fatigue: Secondary | ICD-10-CM

## 2023-12-26 DIAGNOSIS — I7300 Raynaud's syndrome without gangrene: Secondary | ICD-10-CM | POA: Diagnosis not present

## 2023-12-26 DIAGNOSIS — M25561 Pain in right knee: Secondary | ICD-10-CM

## 2023-12-26 DIAGNOSIS — R21 Rash and other nonspecific skin eruption: Secondary | ICD-10-CM

## 2023-12-26 DIAGNOSIS — Z8759 Personal history of other complications of pregnancy, childbirth and the puerperium: Secondary | ICD-10-CM

## 2023-12-26 DIAGNOSIS — M3310 Other dermatopolymyositis, organ involvement unspecified: Secondary | ICD-10-CM

## 2023-12-26 DIAGNOSIS — M25562 Pain in left knee: Secondary | ICD-10-CM

## 2023-12-26 DIAGNOSIS — Z832 Family history of diseases of the blood and blood-forming organs and certain disorders involving the immune mechanism: Secondary | ICD-10-CM

## 2023-12-26 DIAGNOSIS — E7800 Pure hypercholesterolemia, unspecified: Secondary | ICD-10-CM

## 2023-12-26 DIAGNOSIS — G8929 Other chronic pain: Secondary | ICD-10-CM

## 2023-12-26 DIAGNOSIS — M797 Fibromyalgia: Secondary | ICD-10-CM

## 2023-12-26 DIAGNOSIS — M3500 Sicca syndrome, unspecified: Secondary | ICD-10-CM | POA: Diagnosis not present

## 2023-12-26 LAB — CBC WITH DIFFERENTIAL/PLATELET
Absolute Lymphocytes: 1885 {cells}/uL (ref 850–3900)
Absolute Monocytes: 372 {cells}/uL (ref 200–950)
Basophils Absolute: 43 {cells}/uL (ref 0–200)
Basophils Relative: 0.7 %
Eosinophils Absolute: 93 {cells}/uL (ref 15–500)
Eosinophils Relative: 1.5 %
HCT: 38 % (ref 35.0–45.0)
Hemoglobin: 12.7 g/dL (ref 11.7–15.5)
MCH: 29.3 pg (ref 27.0–33.0)
MCHC: 33.4 g/dL (ref 32.0–36.0)
MCV: 87.6 fL (ref 80.0–100.0)
MPV: 9 fL (ref 7.5–12.5)
Monocytes Relative: 6 %
Neutro Abs: 3807 {cells}/uL (ref 1500–7800)
Neutrophils Relative %: 61.4 %
Platelets: 387 Thousand/uL (ref 140–400)
RBC: 4.34 Million/uL (ref 3.80–5.10)
RDW: 13 % (ref 11.0–15.0)
Total Lymphocyte: 30.4 %
WBC: 6.2 Thousand/uL (ref 3.8–10.8)

## 2023-12-26 LAB — COMPREHENSIVE METABOLIC PANEL WITH GFR
AG Ratio: 1.6 (calc) (ref 1.0–2.5)
ALT: 9 U/L (ref 6–29)
AST: 11 U/L (ref 10–30)
Albumin: 4.7 g/dL (ref 3.6–5.1)
Alkaline phosphatase (APISO): 53 U/L (ref 31–125)
BUN/Creatinine Ratio: 8 (calc) (ref 6–22)
BUN: 8 mg/dL (ref 7–25)
CO2: 25 mmol/L (ref 20–32)
Calcium: 9.4 mg/dL (ref 8.6–10.2)
Chloride: 102 mmol/L (ref 98–110)
Creat: 1.02 mg/dL — ABNORMAL HIGH (ref 0.50–0.97)
Globulin: 3 g/dL (ref 1.9–3.7)
Glucose, Bld: 69 mg/dL (ref 65–99)
Potassium: 3.7 mmol/L (ref 3.5–5.3)
Sodium: 138 mmol/L (ref 135–146)
Total Bilirubin: 0.4 mg/dL (ref 0.2–1.2)
Total Protein: 7.7 g/dL (ref 6.1–8.1)
eGFR: 74 mL/min/1.73m2 (ref >=60)

## 2023-12-26 LAB — CK: Total CK: 190 U/L (ref 20–239)

## 2023-12-26 MED ORDER — CYCLOBENZAPRINE HCL 5 MG PO TABS: 5.0000 mg | ORAL_TABLET | Freq: Every evening | ORAL | 0 refills | Status: DC | PRN

## 2023-12-27 ENCOUNTER — Ambulatory Visit: Payer: Self-pay | Admitting: Physician Assistant

## 2023-12-27 NOTE — Progress Notes (Signed)
 Creatinine is borderline elevated-1.02. rest of CMP WNL.  Increase water intake and avoid the use of NSAIDs.   CBC WNL. CK WNL--continues to trend down.

## 2024-02-20 ENCOUNTER — Ambulatory Visit: Payer: Self-pay

## 2024-02-20 NOTE — Telephone Encounter (Signed)
 This RN attempted to reach pt, call disconnected when transferring. LVM requesting pt return call to clinic.  Copied from CRM #[id number]. Topic: Clinical - Red Word Triage >> Feb 20, 2024 10:37 AM Russell PARAS wrote: Red Word that prompted transfer to Nurse Triage:   Started last night, was around cigarette smoke Triggered symptoms Sharp chest pain when breathing in and out Feels she may be developing an asthma attack No shortness of breath yet, but can tell it is progressing.

## 2024-02-20 NOTE — Telephone Encounter (Signed)
 Patient is scheduled for a new patient appointment with pulmonary for tomorrow.  Patient instructed to follow up in the ED and keep her appointment for tomorrow.   Called Nurse Triage reporting Chest Pain.  Symptoms began yesterday.  Interventions attempted: Rescue inhaler.  Symptoms are: unchanged.  Triage Disposition: Go to ED Now (or PCP Triage)  Patient/caregiver understands and will follow disposition?: Yes Reason for Disposition  Taking a deep breath makes pain worse  Answer Assessment - Initial Assessment Questions 1. LOCATION: Where does it hurt?       Right chest pain 2. RADIATION: Does the pain go anywhere else? (e.g., into neck, jaw, arms, back)     no 3. ONSET: When did the chest pain begin? (Minutes, hours or days)      Started last night after being around cigarette smoke  4. PATTERN: Does the pain come and go, or has it been constant since it started?  Does it get worse with exertion?      constant 5. DURATION: How long does it last (e.g., seconds, minutes, hours)     Started last night and continues  6. SEVERITY: How bad is the pain?  (e.g., Scale 1-10; mild, moderate, or severe)     6 out of 10 7. CARDIAC RISK FACTORS: Do you have any history of heart problems or risk factors for heart disease? (e.g., angina, prior heart attack; diabetes, high blood pressure, high cholesterol, smoker, or strong family history of heart disease)     no 8. PULMONARY RISK FACTORS: Do you have any history of lung disease?  (e.g., blood clots in lung, asthma, emphysema, birth control pills)     asthma 9. CAUSE: What do you think is causing the chest pain?     Was around cigarette smoke last night 10. OTHER SYMPTOMS: Do you have any other symptoms? (e.g., dizziness, nausea, vomiting, sweating, fever, difficulty breathing, cough)       no 11. PREGNANCY: Is there any chance you are pregnant? When was your last menstrual period?       no  Protocols used: Chest  Pain-A-AH

## 2024-02-21 ENCOUNTER — Encounter: Payer: Self-pay | Admitting: Pulmonary Disease

## 2024-02-21 ENCOUNTER — Ambulatory Visit: Admitting: Pulmonary Disease

## 2024-02-21 VITALS — BP 118/70 | HR 71 | Temp 98.8°F | Ht 64.0 in | Wt 137.0 lb

## 2024-02-21 DIAGNOSIS — R0789 Other chest pain: Secondary | ICD-10-CM

## 2024-02-21 DIAGNOSIS — Z7722 Contact with and (suspected) exposure to environmental tobacco smoke (acute) (chronic): Secondary | ICD-10-CM | POA: Diagnosis not present

## 2024-02-21 DIAGNOSIS — J4520 Mild intermittent asthma, uncomplicated: Secondary | ICD-10-CM

## 2024-02-21 DIAGNOSIS — R0602 Shortness of breath: Secondary | ICD-10-CM

## 2024-02-21 MED ORDER — BUDESONIDE-FORMOTEROL FUMARATE 160-4.5 MCG/ACT IN AERO: 2.0000 | INHALATION_SPRAY | Freq: Two times a day (BID) | RESPIRATORY_TRACT | 12 refills | Status: AC

## 2024-02-21 NOTE — Progress Notes (Signed)
 @Patient  ID: Elizabeth Jensen, female    DOB: [birth year], 38 y.o.   MRN: [id number]  Chief Complaint  Patient presents with   Consult   Shortness of Breath    Chest pain and sob when walking. Patient says symptoms might be from second hand smoking.    Referring provider: Celestia Harder, NP  HPI:   38 y.o. woman with history of asthma whom are seeing for evaluation of chest tightness, shortness of breath triggered by exposure to cigarette smoke.  Multiple rheumatology notes reviewed.  Patient has a longstanding history of asthma.  Usually well-controlled.  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  She reports lung involvement.  I can see no CT scans to review.  Her most recent rheumatology note mentions no concerning lung findings.  No report of history of ILD.  She recently moved back to the area from [place] .  Family members in the area smoke.  They smoke outside but when they come inside she can still smell smoke.  This reliably reproduces a chest pain or tightness or discomfort as well as associated shortness of breath, difficulty getting deep breath.  She used albuterol.  This does not seem to help much.  Eventually with time things calm down.  Outside these episodes, her breathing is fine.  Asthma controlled.  No shortness of breath cough chest tightness etc.  Most recent chest imaging echo reviewed, chest x-ray in 2016 reveals hyperinflated lungs.  Questionaires / Pulmonary Flowsheets:   ACT:      No data to display          MMRC:     No data to display          Epworth:      No data to display          Tests:   FENO:  No results found for: NITRICOXIDE  PFT:     No data to display          WALK:      No data to display          Imaging: Personally reviewed and as per EMR and discussion in this note No results found.  Lab Results: Personally reviewed CBC    Component Value Date/Time   WBC 6.2 12/26/2023 1311   RBC 4.34  12/26/2023 1311   HGB 12.7 12/26/2023 1311   HCT 38.0 12/26/2023 1311   PLT 387 12/26/2023 1311   MCV 87.6 12/26/2023 1311   MCV 89.8 09/27/2014 1125   MCH 29.3 12/26/2023 1311   MCHC 33.4 12/26/2023 1311   RDW 13.0 12/26/2023 1311   LYMPHSABS 1.6 10/03/2014 1030   MONOABS 0.7 10/03/2014 1030   EOSABS 93 12/26/2023 1311   BASOSABS 43 12/26/2023 1311    BMET    Component Value Date/Time   NA 138 12/26/2023 1311   NA 141 02/03/2023 0847   K 3.7 12/26/2023 1311   CL 102 12/26/2023 1311   CO2 25 12/26/2023 1311   GLUCOSE 69 12/26/2023 1311   BUN 8 12/26/2023 1311   BUN 10 02/03/2023 0847   CREATININE 1.02 (H) 12/26/2023 1311   CALCIUM 9.4 12/26/2023 1311   GFRNONAA 80 (L) 10/03/2014 1030   GFRAA >90 10/03/2014 1030    BNP No results found for: BNP  ProBNP No results found for: PROBNP  Specialty Problems   None   Allergies  Allergen Reactions   Codeine Nausea And Vomiting   Hydrocodone  Nausea And Vomiting   Imuran [  Azathioprine] Hives and Other (See Comments)    Hair loss also   Cellcept [Mycophenolate]     hypercholesterolemia   Methotrexate     Penicillins Nausea Only   Tramadol Nausea Only    Immunization History  Administered Date(s) Administered   Influenza Split 03/31/2012    Past Medical History:  Diagnosis Date   Asthma    NO PROBLEMS NOW   Inflammatory arthritis    Myositis    Rheumatoid aortitis     Tobacco History: Social History   Tobacco Use  Smoking Status Never   Passive exposure: Past  Smokeless Tobacco Never   Counseling given: Not Answered   Continue to not smoke  Outpatient Encounter Medications as of 02/21/2024  Medication Sig   Acetaminophen  (MIDOL  PO) Take by mouth.   Ascorbic Acid (VITAMIN C PO) Take by mouth.   budesonide -formoterol  (SYMBICORT ) 160-4.5 MCG/ACT inhaler Inhale 2 puffs into the lungs 2 (two) times daily.   CALCIUM PO Take by mouth.   clindamycin-benzoyl peroxide (BENZACLIN) gel Apply to affected  area 2 times daily   Clobetasol  Prop-Niacinamide  0.05-4 % OINT Apply 1 Application topically 2 (two) times daily as needed.   cyclobenzaprine  (FLEXERIL ) 5 MG tablet Take 1 tablet (5 mg total) by mouth at bedtime as needed for muscle spasms.   hydroxychloroquine  (PLAQUENIL ) 200 MG tablet Take 1 tablet (200 mg total) by mouth 2 (two) times daily.   ibuprofen  (ADVIL ) 600 MG tablet Take 600 mg by mouth every 8 (eight) hours as needed.   Magnesium Glycinate 100 MG CAPS    Multiple Vitamin (MULTIVITAMIN) capsule Take 1 capsule by mouth daily.   Omega-3 Fatty Acids (FISH OIL PO) Take by mouth.   Probiotic Product (PROBIOTIC PO) Take by mouth daily.   Red Yeast Rice Extract (RED YEAST RICE PO) Take by mouth.   VITAMIN D  PO Take 500 mcg by mouth daily.   VITAMIN E PO Take by mouth.   folic acid (FOLVITE) 1 MG tablet Take 1 mg by mouth daily. (Patient not taking: Reported on 02/21/2024)   Prenatal Vit-Fe Fumarate-FA (PRENATAL MULTIVITAMIN) TABS tablet Take 1 tablet by mouth daily at 12 noon. (Patient not taking: Reported on 02/21/2024)   promethazine (PHENERGAN) 25 MG tablet Take by mouth. (Patient not taking: Reported on 02/21/2024)   No facility-administered encounter medications on file as of 02/21/2024.     Review of Systems  Review of Systems  No chest pain with exertion.  Orthopnea or PND.  Comprehensive review of systems otherwise negative. Physical Exam  BP 118/70 (BP Location: Left Arm, Patient Position: Sitting, Cuff Size: Normal)   Pulse 71   Temp 98.8 F (37.1 C) (Oral)   Ht 5' 4 (1.626 m)   Wt 137 lb (62.1 kg)   SpO2 99%   BMI 23.52 kg/m   Wt Readings from Last 5 Encounters:  02/21/24 137 lb (62.1 kg)  12/26/23 134 lb (60.8 kg)  08/22/23 137 lb (62.1 kg)  05/24/23 146 lb (66.2 kg)  02/22/23 139 lb (63 kg)    BMI Readings from Last 5 Encounters:  02/21/24 23.52 kg/m  12/26/23 23.00 kg/m  08/22/23 23.52 kg/m  05/24/23 25.06 kg/m  02/22/23 23.86 kg/m      Physical Exam General: Sitting in chair, no acute distress Eyes: EOMI, no icterus Neck: Supple, no JVP Pulmonary: Clear, no work of breathing Cardiovascular: Warm, no edema Abdomen: Nondistended MSK: No synovitis, no joint effusion Neuro: Normal gait, no weakness Psych: Normal mood, full affect  Assessment & Plan:   Chest pain, shortness of breath: Triggered by exposure to cigarette smoke.  Suspect triggering underlying asthma.  No other episodes without exposure to cigarette smoke.  Doing well otherwise.  Escalate therapies as below.  Consider additional workup if not responding well.  Consider chest imaging, cross-sectional imaging given report history of dermatomyositis.  Lung involvement is possible.  Mild intermittent asthma: With recent worsening with environmental trigger, cigarette smoke.  Unable to reliably predict when she will be exposed to this.  Start Symbicort  2 puff twice a day every day to minimize symptoms with exposure.  Continue albuterol as needed.  Albuterol historically has not helped much with recent symptoms.   Return in about 3 months (around 05/22/2024) for f/u Dr. Annella.   Donnice JONELLE Annella, MD 02/21/2024

## 2024-02-21 NOTE — Telephone Encounter (Signed)
 fyi

## 2024-02-27 MED ORDER — CYCLOBENZAPRINE HCL 5 MG PO TABS: 5.0000 mg | ORAL_TABLET | Freq: Every evening | ORAL | 0 refills | Status: DC | PRN

## 2024-02-27 MED ORDER — HYDROXYCHLOROQUINE SULFATE 200 MG PO TABS: 200.0000 mg | ORAL_TABLET | Freq: Two times a day (BID) | ORAL | 0 refills | Status: DC

## 2024-02-27 NOTE — Telephone Encounter (Signed)
 Last Fill: 11/30/2023 Hydroxychloroquine  12/26/2023 Flexeril   Eye exam: 03/09/2023 WNL   Labs: 12/26/2023 Creatinine is borderline elevated-1.02. rest of CMP WNL.  Increase water intake and avoid the use of NSAIDs.   CBC WNL. CK WNL--continues to trend down.  Next Visit: 03/27/2024  Last Visit: 12/26/2023  IK:Izmfjunfbndpupd sine myositis (HCC)   Current Dose per office note 12/26/2023: Plaquenil  200 mg 1 tablet BID, flexeril  not mentioned   Okay to refill Plaquenil  and Flexeril ?

## 2024-03-13 NOTE — Progress Notes (Signed)
 Office Visit Note  Patient: Elizabeth Jensen             Date of Birth: 02-02-1986           MRN: [id number]             PCP: Celestia Harder, NP Referring: Celestia Harder, NP Visit Date: 03/27/2024 Occupation: Data Unavailable  Subjective:  Medication monitoring   History of Present Illness: Elizabeth Jensen is a 38 y.o. female with history of dermatomyositis.  Patient is taking Plaquenil  200 mg 1 tablet by mouth twice daily.  She continues to tolerate Plaquenil  without any side effects and has not had any gaps in therapy.  She denies any signs or symptoms of a dermatomyositis flare.  She denies any exacerbation of the rash on her upper chest.  Patient states that she is scheduled to establish care with Dr. Alm on 06/13/2024. Patient states that she establish care at [facility] pulmonary.  She was evaluated by Dr. Annella on 02/21/2024.  According to the patient her symptoms of asthma have been better controlled since initiating Symbicort .  Patient states that she did not have any imaging or pulmonary function testing performed at that visit. She denies any muscular weakness.  She experiences muscle cramping and tension at times.  She has noticed a significant improvement in her symptoms taking Flexeril  5 mg at bedtime.  Patient states that she often has to take 2 tablets at bedtime to help alleviate her symptoms and has requested to increase the dose of Flexeril  to 10 mg nightly. She denies any joint swelling at this time. She is scheduled for a Plaquenil  examination in January or February 2026.   Activities of Daily Living:  Patient reports morning stiffness for 0 minute.   Patient Denies nocturnal pain.  Difficulty dressing/grooming: Denies Difficulty climbing stairs: Denies Difficulty getting out of chair: Denies Difficulty using hands for taps, buttons, cutlery, and/or writing: Denies  Review of Systems  Constitutional:  Negative for fatigue.  HENT:  Negative for mouth sores and mouth  dryness.   Eyes:  Positive for dryness.  Respiratory:  Positive for shortness of breath.   Cardiovascular:  Negative for chest pain and palpitations.  Gastrointestinal:  Negative for blood in stool, constipation and diarrhea.  Endocrine: Negative for increased urination.  Genitourinary:  Negative for involuntary urination.  Musculoskeletal:  Negative for joint pain, gait problem, joint pain, joint swelling, myalgias, muscle weakness, morning stiffness, muscle tenderness and myalgias.  Skin:  Positive for rash and sensitivity to sunlight. Negative for color change and hair loss.  Allergic/Immunologic: Negative for susceptible to infections.  Neurological:  Positive for headaches. Negative for dizziness.  Hematological:  Negative for swollen glands.  Psychiatric/Behavioral:  Negative for depressed mood and sleep disturbance. The patient is not nervous/anxious.     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   Abnormal finding on Pap smear, HPV DNA positive 05/10/2012   Dermatomyositis (HCC) 08/31/2011    Past Medical History:  Diagnosis Date   Asthma    NO PROBLEMS NOW   Inflammatory arthritis    Myositis    Rheumatoid aortitis     Family History  Problem Relation Age of Onset   Diabetes Mother    Healthy Father    Healthy Sister    Healthy Sister    Sarcoidosis Maternal Aunt  Other Maternal Grandmother        lupus nephritis   Diabetes Other    Past Surgical History:  Procedure Laterality Date   DILATION AND CURETTAGE OF UTERUS  04/12/2023   EYE SURGERY Left    MUSCLE BIOPSY  09/02/2011   Procedure: MUSCLE BIOPSY;  Surgeon: Krystal CHRISTELLA Spinner, MD;  Location: [facility];  Service: General;  Laterality: Left;  Quadriceps muscle biospy   Social History   Tobacco Use   Smoking status: Never     Passive exposure: Past   Smokeless tobacco: Never  Vaping Use   Vaping status: Never Used  Substance Use Topics   Alcohol use: Not Currently    Comment: occ   Drug use: No   Social History   Social History Narrative   Single. Education: [employer]. Exercise: Walk     Immunization History  Administered Date(s) Administered   Influenza Split 03/31/2012     Objective: Vital Signs: BP 114/74   Pulse 84   Temp 97.9 F (36.6 C)   Resp 14   Ht 5' 3 (1.6 m)   Wt 139 lb 4.8 oz (63.2 kg)   LMP 03/22/2024   BMI 24.68 kg/m    Physical Exam Vitals and nursing note reviewed.  Constitutional:      Appearance: She is well-developed.  HENT:     Head: Normocephalic and atraumatic.  Eyes:     Conjunctiva/sclera: Conjunctivae normal.  Cardiovascular:     Rate and Rhythm: Normal rate and regular rhythm.     Heart sounds: Normal heart sounds.  Pulmonary:     Effort: Pulmonary effort is normal.     Breath sounds: Normal breath sounds.  Abdominal:     General: Bowel sounds are normal.     Palpations: Abdomen is soft.  Musculoskeletal:     Cervical back: Normal range of motion.  Lymphadenopathy:     Cervical: No cervical adenopathy.  Skin:    General: Skin is warm and dry.     Capillary Refill: Capillary refill takes less than 2 seconds.  Neurological:     Mental Status: She is alert and oriented to person, place, and time.  Psychiatric:        Behavior: Behavior normal.      Musculoskeletal Exam: C-spine, thoracic spine, lumbar spine have good range of motion.  No midline spinal tenderness.  No SI joint tenderness.  Shoulder joints, elbow joints, wrist joints, MCPs, PIPs, DIPs have good range of motion with no synovitis.  Complete fist formation bilaterally.  Hip joints have good range of motion with no groin pain.  Knee joints have good range of motion no warmth or effusion.  Ankle joints have good range of motion no tenderness or joint swelling.  No evidence of Achilles  tendinitis or plantar fasciitis.   CDAI Exam: CDAI Score: -- Patient Global: --; Provider Global: -- Swollen: --; Tender: -- Joint Exam 03/27/2024   No joint exam has been documented for this visit   There is currently no information documented on the homunculus. Go to the Rheumatology activity and complete the homunculus joint exam.  Investigation: No additional findings.  Imaging: No results found.  Recent Labs: Lab Results  Component Value Date   WBC 6.2 12/26/2023   HGB 12.7 12/26/2023   PLT 387 12/26/2023   NA 138 12/26/2023   K 3.7 12/26/2023   CL 102 12/26/2023   CO2 25 12/26/2023   GLUCOSE 69 12/26/2023   BUN 8  12/26/2023   CREATININE 1.02 (H) 12/26/2023   BILITOT 0.4 12/26/2023   ALKPHOS 57 02/03/2023   AST 11 12/26/2023   ALT 9 12/26/2023   PROT 7.7 12/26/2023   ALBUMIN 4.3 02/03/2023   CALCIUM 9.4 12/26/2023   GFRAA >90 10/03/2014    Speciality Comments: PLQ Eye Exam: 03/09/2023 WNL @ [facility] Follow up in 1 year   Procedures:  No procedures performed Allergies: Codeine, Hydrocodone , Imuran [azathioprine], Cellcept [mycophenolate], Methotrexate , Penicillins, and Tramadol    Assessment / Plan:     Visit Diagnoses: 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  Presented initially with weight loss, dysphagia and severe muscle weakness: No signs or symptoms of a flare.  She has clinically been doing well taking Plaquenil  200 mg 1 tablet by mouth twice daily.  She is tolerating Plaquenil  without any side effects and has not had any gaps in therapy.  She has not experienced any muscular weakness at this time.  CK was 190 on 12/26/2023.  Plan to recheck CK today. No synovitis noted on examination today. She is scheduled to establish care with Dr. Alm at [facility] on 06/13/2024.  She has not had any recent exacerbation of the rash on her upper chest.  She uses  clobetasol -niacinamide  ointment as needed.   Patient has noticed an improvement in the shortness of breath she is experiencing since starting on Symbicort .  She established care with Dr. Annella as encouraged for ILD screening.  The patient did not have pulmonary function testing or any lung imaging performed. Dr. Dolphus has recommended proceeding with a screening high resolution chest CT-order will be placed.  She is scheduled to follow back up with Dr. Annella on 05/22/2024. Patient is aware that she should continue age-related cancer screenings due to the increased risk for malignancy in patients with dermatomyositis. She will remain on Plaquenil  as prescribed.  She was advised to notify us  if she develops any signs or symptoms of a flare.  She will follow-up in the office in 3 months or sooner if needed. - Plan: CK  High risk medication use - Plaquenil  200 mg 1 tablet BID- 2022.   Previous tx: MTX-GI SEs, Enbrel-IR, CellCept 2016-2017-DC'd due high cholesterol.  CBC and CMP drawn on 12/26/23 PLQ Eye Exam: 03/09/2023 WNL @ [facility] Follow up in 1 year   - Plan: CBC with Differential/Platelet, Comprehensive metabolic panel with GFR  Raynaud's syndrome without gangrene: No digital ulcerations noted.  No signs of sclerodactyly noted.  She was advised to notify us  if she develops any new or worsening symptoms.  Discussed the importance of keeping her core body temperature warm as well as wearing gloves and socks.    Sicca syndrome: Chronic eye dryness -She uses refresh eyedrops 3 times daily as needed.  Rash - Dr. Jorizzo: No acute exacerbation recently.  Scheduled to establish care with Dr. Alm on 06/13/2024.  Other fatigue: Her energy level has been stable.   Fibromyalgia: She takes Flexeril  5 mg 2 tablets at bedtime for muscle spasms which has helped to alleviate her symptoms from flares.  Patient found 5 mg to be an effective at managing her symptoms and has been taking 2 tablets  nightly.  She requested a new prescription for Flexeril  for 10 mg 1 tablet at bedtime to help better manage her symptoms.  A new prescription will be sent to the pharmacy today.  Other medical conditions  are listed as follows:  Family history of lupus nephritis-maternal grandmother  Family history of sarcoidosis-maternal aunt  History of miscarriage - Early November 2024-required D&C. IUD placed.  High cholesterol  Orders: Orders Placed This Encounter  Procedures   CBC with Differential/Platelet   Comprehensive metabolic panel with GFR   CK   Meds ordered this encounter  Medications   cyclobenzaprine  (FLEXERIL ) 10 MG tablet    Sig: Take 1 tablet (10 mg total) by mouth at bedtime.    Dispense:  30 tablet    Refill:  0     Follow-Up Instructions: Return in 3 months (on 06/27/2024) for Dermatomyositis.   Waddell CHRISTELLA Craze, PA-C  Note - This record has been created using Dragon software.  Chart creation errors have been sought, but may not always  have been located. Such creation errors do not reflect on  the standard of medical care.

## 2024-03-27 ENCOUNTER — Encounter: Payer: Self-pay | Admitting: Physician Assistant

## 2024-03-27 ENCOUNTER — Encounter: Payer: Self-pay | Admitting: Pharmacist

## 2024-03-27 ENCOUNTER — Ambulatory Visit: Attending: Physician Assistant | Admitting: Physician Assistant

## 2024-03-27 VITALS — BP 114/74 | HR 84 | Temp 97.9°F | Resp 14 | Ht 63.0 in | Wt 139.3 lb

## 2024-03-27 DIAGNOSIS — I7300 Raynaud's syndrome without gangrene: Secondary | ICD-10-CM

## 2024-03-27 DIAGNOSIS — Z832 Family history of diseases of the blood and blood-forming organs and certain disorders involving the immune mechanism: Secondary | ICD-10-CM

## 2024-03-27 DIAGNOSIS — R21 Rash and other nonspecific skin eruption: Secondary | ICD-10-CM

## 2024-03-27 DIAGNOSIS — M3500 Sicca syndrome, unspecified: Secondary | ICD-10-CM

## 2024-03-27 DIAGNOSIS — Z79899 Other long term (current) drug therapy: Secondary | ICD-10-CM

## 2024-03-27 DIAGNOSIS — E7800 Pure hypercholesterolemia, unspecified: Secondary | ICD-10-CM

## 2024-03-27 DIAGNOSIS — M3310 Other dermatopolymyositis, organ involvement unspecified: Secondary | ICD-10-CM

## 2024-03-27 DIAGNOSIS — Z8759 Personal history of other complications of pregnancy, childbirth and the puerperium: Secondary | ICD-10-CM

## 2024-03-27 DIAGNOSIS — M797 Fibromyalgia: Secondary | ICD-10-CM

## 2024-03-27 DIAGNOSIS — Z8269 Family history of other diseases of the musculoskeletal system and connective tissue: Secondary | ICD-10-CM

## 2024-03-27 DIAGNOSIS — R5383 Other fatigue: Secondary | ICD-10-CM

## 2024-03-27 MED ORDER — CYCLOBENZAPRINE HCL 10 MG PO TABS: 10.0000 mg | ORAL_TABLET | Freq: Every day | ORAL | 0 refills | Status: DC

## 2024-03-27 NOTE — Addendum Note (Signed)
 Addended by: DAYNE SHERRY RAMAN on: 03/27/2024 04:42 PM   Modules accepted: Orders

## 2024-03-27 NOTE — Progress Notes (Signed)
 HRCT and PFTs ordered. Discussed with Dr. Annella - he will plan to discuss HRCT results on 05/22/2024 at upcoming OV  Sherry Pennant, PharmD, MPH, BCPS, CPP Clinical Pharmacist [facility])

## 2024-03-28 ENCOUNTER — Ambulatory Visit: Payer: Self-pay | Admitting: Physician Assistant

## 2024-03-28 DIAGNOSIS — M3310 Other dermatopolymyositis, organ involvement unspecified: Secondary | ICD-10-CM

## 2024-03-28 LAB — CBC WITH DIFFERENTIAL/PLATELET
Absolute Lymphocytes: 2087 {cells}/uL (ref 850–3900)
Absolute Monocytes: 362 {cells}/uL (ref 200–950)
Basophils Absolute: 28 {cells}/uL (ref 0–200)
Basophils Relative: 0.4 %
Eosinophils Absolute: 114 {cells}/uL (ref 15–500)
Eosinophils Relative: 1.6 %
HCT: 37.4 % (ref 35.0–45.0)
Hemoglobin: 12.9 g/dL (ref 11.7–15.5)
MCH: 29.5 pg (ref 27.0–33.0)
MCHC: 34.5 g/dL (ref 32.0–36.0)
MCV: 85.4 fL (ref 80.0–100.0)
MPV: 8.8 fL (ref 7.5–12.5)
Monocytes Relative: 5.1 %
Neutro Abs: 4509 {cells}/uL (ref 1500–7800)
Neutrophils Relative %: 63.5 %
Platelets: 378 Thousand/uL (ref 140–400)
RBC: 4.38 Million/uL (ref 3.80–5.10)
RDW: 13.7 % (ref 11.0–15.0)
Total Lymphocyte: 29.4 %
WBC: 7.1 Thousand/uL (ref 3.8–10.8)

## 2024-03-28 LAB — COMPREHENSIVE METABOLIC PANEL WITH GFR
AG Ratio: 1.6 (calc) (ref 1.0–2.5)
ALT: 12 U/L (ref 6–29)
AST: 14 U/L (ref 10–30)
Albumin: 4.6 g/dL (ref 3.6–5.1)
Alkaline phosphatase (APISO): 61 U/L (ref 31–125)
BUN: 13 mg/dL (ref 7–25)
CO2: 26 mmol/L (ref 20–32)
Calcium: 9.6 mg/dL (ref 8.6–10.2)
Chloride: 103 mmol/L (ref 98–110)
Creat: 0.95 mg/dL (ref 0.50–0.97)
Globulin: 2.8 g/dL (ref 1.9–3.7)
Glucose, Bld: 95 mg/dL (ref 65–99)
Potassium: 3.9 mmol/L (ref 3.5–5.3)
Sodium: 139 mmol/L (ref 135–146)
Total Bilirubin: 0.4 mg/dL (ref 0.2–1.2)
Total Protein: 7.4 g/dL (ref 6.1–8.1)
eGFR: 80 mL/min/1.73m2 (ref >=60)

## 2024-03-28 LAB — CK: Total CK: 269 U/L — ABNORMAL HIGH (ref 20–239)

## 2024-03-28 NOTE — Progress Notes (Signed)
 CBC and CMP WNL CK is borderline elevated-269-please clarify if she has been performing any strength training or strenuous exercise? She was not experiencing any symptoms of a flare at the visit yesterday.  Recheck CK in 1 month.   Please also notify patient that an order for a chest CT and PFTs has been placed and Dr. Annella has been notified.

## 2024-04-09 ENCOUNTER — Other Ambulatory Visit

## 2024-04-27 ENCOUNTER — Other Ambulatory Visit

## 2024-05-15 ENCOUNTER — Other Ambulatory Visit: Payer: Self-pay | Admitting: *Deleted

## 2024-05-15 DIAGNOSIS — M3310 Other dermatopolymyositis, organ involvement unspecified: Secondary | ICD-10-CM

## 2024-05-22 ENCOUNTER — Ambulatory Visit: Admitting: Pulmonary Disease

## 2024-05-31 ENCOUNTER — Ambulatory Visit: Payer: Self-pay | Admitting: Physician Assistant

## 2024-05-31 LAB — CK: Total CK: 156 U/L (ref 20–239)

## 2024-05-31 NOTE — Progress Notes (Signed)
 CK WNL-great news!

## 2024-06-01 ENCOUNTER — Other Ambulatory Visit: Payer: Self-pay | Admitting: *Deleted

## 2024-06-01 DIAGNOSIS — I7300 Raynaud's syndrome without gangrene: Secondary | ICD-10-CM

## 2024-06-01 DIAGNOSIS — M3310 Other dermatopolymyositis, organ involvement unspecified: Secondary | ICD-10-CM

## 2024-06-01 DIAGNOSIS — M3500 Sicca syndrome, unspecified: Secondary | ICD-10-CM

## 2024-06-01 MED ORDER — HYDROXYCHLOROQUINE SULFATE 200 MG PO TABS: 200.0000 mg | ORAL_TABLET | Freq: Two times a day (BID) | ORAL | 0 refills | Status: AC

## 2024-06-01 MED ORDER — CYCLOBENZAPRINE HCL 10 MG PO TABS: 10.0000 mg | ORAL_TABLET | Freq: Every day | ORAL | 0 refills | Status: AC

## 2024-06-01 NOTE — Telephone Encounter (Signed)
 Last Fill: Flexeril  03/27/2024, PLQ 02/27/2024  Eye exam: 03/09/2023 WNL, Patient scheduled 06/2024  Labs: 03/27/2024, CBC and CMP WNL CK is borderline elevated-269-please clarify if she has been performing any strength training or strenuous exercise? She was not experiencing any symptoms of a flare at the visit yesterday.  Recheck CK in 1 month.    Please also notify patient that an order for a chest CT and PFTs has been placed and Dr. Annella has been notified.    Next Visit: 07/03/2024  Last Visit: 03/27/2024  IK:Izmfjunfbndpupd sine myositis   Current Dose per office note 03/27/2024: Plaquenil  200 mg 1 tablet BID, Flexeril  5 mg 2 tablets at bedtime for muscle spasms   Okay to refill Plaquenil  and Flexeril ?

## 2024-06-01 NOTE — Telephone Encounter (Signed)
 RX sent to Dr. Dolphus

## 2024-06-04 NOTE — Telephone Encounter (Signed)
 Please see message from patient.   Okay to refer to [facility] for transfer of care? Please review and sign the pended referral.

## 2024-06-04 NOTE — Telephone Encounter (Signed)
 Ok to place referral for transfer of care

## 2024-06-13 ENCOUNTER — Ambulatory Visit: Admitting: Dermatology

## 2024-06-19 NOTE — Progress Notes (Unsigned)
 "  Office Visit Note  Patient: Elizabeth Jensen             Date of Birth: [birth year]           MRN: [id number]             PCP: Celestia Harder, NP Referring: Celestia Harder, NP Visit Date: 07/03/2024 Occupation: Data Unavailable  Subjective:  No chief complaint on file.   History of Present Illness: Elizabeth Jensen is a 39 y.o. female ***     Activities of Daily Living:  Patient reports morning stiffness for *** {minute/hour:19697}.   Patient {ACTIONS;DENIES/REPORTS:21021675::Denies} nocturnal pain.  Difficulty dressing/grooming: {ACTIONS;DENIES/REPORTS:21021675::Denies} Difficulty climbing stairs: {ACTIONS;DENIES/REPORTS:21021675::Denies} Difficulty getting out of chair: {ACTIONS;DENIES/REPORTS:21021675::Denies} Difficulty using hands for taps, buttons, cutlery, and/or writing: {ACTIONS;DENIES/REPORTS:21021675::Denies}  No Rheumatology ROS completed.   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   Abnormal finding on Pap smear, HPV DNA positive 05/10/2012   Dermatomyositis (HCC) 08/31/2011    Past Medical History:  Diagnosis Date   Asthma    NO PROBLEMS NOW   Inflammatory arthritis    Myositis    Rheumatoid aortitis     Family History  Problem Relation Age of Onset   Diabetes Mother    Healthy Father    Healthy Sister    Healthy Sister    Sarcoidosis Maternal Aunt    Other Maternal Grandmother        lupus nephritis   Diabetes Other    Past Surgical History:  Procedure Laterality Date   DILATION AND CURETTAGE OF UTERUS  04/12/2023   EYE SURGERY Left    MUSCLE BIOPSY  09/02/2011   Procedure: MUSCLE BIOPSY;  Surgeon: Krystal CHRISTELLA Spinner, MD;  Location: [facility];  Service: General;  Laterality: Left;  Quadriceps muscle biospy   Social History[1] Social History   Social History  Narrative   Single. Education: [employer]. Exercise: Walk     Immunization History  Administered Date(s) Administered   Influenza Split 03/31/2012     Objective: Vital Signs: There were no vitals taken for this visit.   Physical Exam   Musculoskeletal Exam: ***  CDAI Exam: CDAI Score: -- Patient Global: --; Provider Global: -- Swollen: --; Tender: -- Joint Exam 07/03/2024   No joint exam has been documented for this visit   There is currently no information documented on the homunculus. Go to the Rheumatology activity and complete the homunculus joint exam.  Investigation: No additional findings.  Imaging: No results found.  Recent Labs: Lab Results  Component Value Date   WBC 7.1 03/27/2024   HGB 12.9 03/27/2024   PLT 378 03/27/2024   NA 139 03/27/2024   K 3.9 03/27/2024   CL 103 03/27/2024   CO2 26 03/27/2024   GLUCOSE 95 03/27/2024   BUN 13 03/27/2024   CREATININE 0.95 03/27/2024   BILITOT 0.4 03/27/2024   ALKPHOS 57 02/03/2023   AST 14 03/27/2024   ALT 12 03/27/2024   PROT 7.4 03/27/2024   ALBUMIN 4.3 02/03/2023   CALCIUM 9.6 03/27/2024   GFRAA >90 10/03/2014    Speciality Comments: PLQ Eye Exam: 03/09/2023 WNL @ [facility] Follow up in 1 year  Scheduled appt 06/2024  Procedures:  No procedures performed Allergies: Codeine, Hydrocodone , Imuran [azathioprine], Cellcept [mycophenolate], Methotrexate , Penicillins, and Tramadol   Assessment / Plan:  Visit Diagnoses: Dermatomyositis sine myositis (HCC)  Sicca syndrome  High risk medication use  Raynaud's syndrome without gangrene  Other fatigue  Fibromyalgia  Family history of lupus nephritis-maternal grandmother  Family history of sarcoidosis-maternal aunt  History of miscarriage  Orders: No orders of the defined types were placed in this encounter.  No orders of the defined types were placed in this encounter.   Face-to-face time spent with patient was *** minutes. Greater  than 50% of time was spent in counseling and coordination of care.  Follow-Up Instructions: No follow-ups on file.   Waddell CHRISTELLA Craze, PA-C  Note - This record has been created using Dragon software.  Chart creation errors have been sought, but may not always  have been located. Such creation errors do not reflect on  the standard of medical care.     [1]  Social History Tobacco Use   Smoking status: Never    Passive exposure: Past   Smokeless tobacco: Never  Vaping Use   Vaping status: Never Used  Substance Use Topics   Alcohol use: Not Currently    Comment: occ   Drug use: No   "

## 2024-07-03 ENCOUNTER — Ambulatory Visit: Payer: Self-pay | Admitting: Physician Assistant

## 2024-07-03 DIAGNOSIS — M3500 Sicca syndrome, unspecified: Secondary | ICD-10-CM

## 2024-07-03 DIAGNOSIS — Z79899 Other long term (current) drug therapy: Secondary | ICD-10-CM

## 2024-07-03 DIAGNOSIS — Z8269 Family history of other diseases of the musculoskeletal system and connective tissue: Secondary | ICD-10-CM

## 2024-07-03 DIAGNOSIS — Z8759 Personal history of other complications of pregnancy, childbirth and the puerperium: Secondary | ICD-10-CM

## 2024-07-03 DIAGNOSIS — Z832 Family history of diseases of the blood and blood-forming organs and certain disorders involving the immune mechanism: Secondary | ICD-10-CM

## 2024-07-03 DIAGNOSIS — I7300 Raynaud's syndrome without gangrene: Secondary | ICD-10-CM

## 2024-07-03 DIAGNOSIS — M3310 Other dermatopolymyositis, organ involvement unspecified: Secondary | ICD-10-CM

## 2024-07-03 DIAGNOSIS — R5383 Other fatigue: Secondary | ICD-10-CM

## 2024-07-03 DIAGNOSIS — M797 Fibromyalgia: Secondary | ICD-10-CM
